# Patient Record
Sex: Female | Born: 1995 | Race: Black or African American | Hispanic: No | Marital: Single | Smoking: Never smoker
Health system: Southern US, Community
[De-identification: ages and names within clinical notes are randomized; demographics above are authoritative.]

## PROBLEM LIST (undated history)

## (undated) DIAGNOSIS — D649 Anemia, unspecified: Secondary | ICD-10-CM

## (undated) HISTORY — PX: INDUCED ABORTION: SHX677

---

## 2014-02-09 LAB — PROCEDURE REPORT - SCANNED: PAP SMEAR: ABNORMAL — AB

## 2014-03-05 ENCOUNTER — Encounter (HOSPITAL_COMMUNITY): Payer: Self-pay | Admitting: Emergency Medicine

## 2014-03-05 ENCOUNTER — Emergency Department (HOSPITAL_COMMUNITY)
Admission: EM | Admit: 2014-03-05 | Discharge: 2014-03-06 | Disposition: A | Discharge status: Home / Self Care | Payer: Medicaid Other | Attending: Emergency Medicine | Admitting: Emergency Medicine

## 2014-03-05 ENCOUNTER — Emergency Department (HOSPITAL_COMMUNITY): Payer: Medicaid Other

## 2014-03-05 DIAGNOSIS — Z3A00 Weeks of gestation of pregnancy not specified: Secondary | ICD-10-CM | POA: Insufficient documentation

## 2014-03-05 DIAGNOSIS — N898 Other specified noninflammatory disorders of vagina: Secondary | ICD-10-CM | POA: Insufficient documentation

## 2014-03-05 DIAGNOSIS — O9989 Other specified diseases and conditions complicating pregnancy, childbirth and the puerperium: Secondary | ICD-10-CM | POA: Diagnosis not present

## 2014-03-05 DIAGNOSIS — R519 Headache, unspecified: Secondary | ICD-10-CM

## 2014-03-05 DIAGNOSIS — R51 Headache: Secondary | ICD-10-CM | POA: Diagnosis not present

## 2014-03-05 DIAGNOSIS — Z3490 Encounter for supervision of normal pregnancy, unspecified, unspecified trimester: Secondary | ICD-10-CM

## 2014-03-05 LAB — COMPREHENSIVE METABOLIC PANEL
ALK PHOS: 40 U/L (ref 39–117)
ALT: 18 U/L (ref 0–35)
AST: 21 U/L (ref 0–37)
Albumin: 3 g/dL — ABNORMAL LOW (ref 3.5–5.2)
Anion gap: 12 (ref 5–15)
BUN: 8 mg/dL (ref 6–23)
CHLORIDE: 105 meq/L (ref 96–112)
CO2: 22 mEq/L (ref 19–32)
Calcium: 8.8 mg/dL (ref 8.4–10.5)
Creatinine, Ser: 0.66 mg/dL (ref 0.50–1.10)
GFR calc Af Amer: >90 mL/min (ref >90)
GFR calc non Af Amer: >90 mL/min (ref >90)
Glucose, Bld: 73 mg/dL (ref 70–99)
POTASSIUM: 4.1 meq/L (ref 3.7–5.3)
Sodium: 139 mEq/L (ref 137–147)
Total Protein: 6.8 g/dL (ref 6.0–8.3)

## 2014-03-05 LAB — URINALYSIS, ROUTINE W REFLEX MICROSCOPIC
Bilirubin Urine: NEGATIVE
Glucose, UA: NEGATIVE mg/dL
Hgb urine dipstick: NEGATIVE
KETONES UR: NEGATIVE mg/dL
Leukocytes, UA: NEGATIVE
NITRITE: NEGATIVE
Protein, ur: NEGATIVE mg/dL
Specific Gravity, Urine: 1.024 (ref 1.005–1.030)
Urobilinogen, UA: 0.2 mg/dL (ref 0.0–1.0)
pH: 6.5 (ref 5.0–8.0)

## 2014-03-05 LAB — TYPE AND SCREEN
ABO/RH(D): B NEG
Antibody Screen: NEGATIVE

## 2014-03-05 LAB — PREGNANCY, URINE: PREG TEST UR: POSITIVE — AB

## 2014-03-05 LAB — CBC
HCT: 35.4 % — ABNORMAL LOW (ref 36.0–46.0)
HEMOGLOBIN: 12.1 g/dL (ref 12.0–15.0)
MCH: 30 pg (ref 26.0–34.0)
MCHC: 34.2 g/dL (ref 30.0–36.0)
MCV: 87.6 fL (ref 78.0–100.0)
Platelets: 271 10*3/uL (ref 150–400)
RBC: 4.04 MIL/uL (ref 3.87–5.11)
RDW: 12.6 % (ref 11.5–15.5)
WBC: 4.6 10*3/uL (ref 4.0–10.5)

## 2014-03-05 LAB — HCG, QUANTITATIVE, PREGNANCY: hCG, Beta Chain, Quant, S: 19904 m[IU]/mL — ABNORMAL HIGH (ref <5)

## 2014-03-05 LAB — LIPASE, BLOOD: Lipase: 28 U/L (ref 11–59)

## 2014-03-05 LAB — WET PREP, GENITAL
Trich, Wet Prep: NONE SEEN
Yeast Wet Prep HPF POC: NONE SEEN

## 2014-03-05 LAB — ABO/RH: ABO/RH(D): B NEG

## 2014-03-05 MED ORDER — ACETAMINOPHEN 500 MG PO TABS
1000.0000 mg | ORAL_TABLET | Freq: Once | ORAL | Status: AC
  Administered 2014-03-05: 1000 mg via ORAL
  Filled 2014-03-05: qty 2

## 2014-03-05 NOTE — ED Notes (Signed)
Pt reports discharge that has been occurring "for a little while" and lower abd pain that began today - pt also admits to HA - denies fever, neck pain, vaginal bleeding or any other associating complaints. Pt is gravid however states she does not known how many weeks gestation and has not been to the OBGYN.

## 2014-03-05 NOTE — ED Notes (Signed)
Terri Piedraourtney Forcucci PA at bedside.

## 2014-03-05 NOTE — ED Notes (Signed)
Presents with intermittent abdominal cramping and vaginal discharge-unsure of when discharge started and description of discharge, cramping began today. Denies bleeding,  Unsure of how many weeks gestation she is but abdomen is gravid and she reports positive urine pregnancy test, last period in June. Pt also reports headache began today and has not gone away.

## 2014-03-05 NOTE — ED Provider Notes (Signed)
CSN: [id number]     Arrival date & time 03/05/14  2047 History   First MD Initiated Contact with Patient 03/05/14 2128     Chief Complaint  Patient presents with  . Abdominal Cramping  . Headache   Patient is a 18 y.o. female presenting with cramps and headaches.  Abdominal Cramping Associated symptoms: vaginal discharge   Associated symptoms: no chest pain, no chills, no constipation, no diarrhea, no dysuria, no fatigue, no fever, no hematuria, no nausea, no shortness of breath, no vaginal bleeding and no vomiting   Headache Associated symptoms: abdominal pain   Associated symptoms: no diarrhea, no dizziness, no fatigue, no fever, no nausea and no vomiting     Patient is a [age] y.o. Female who presents to the ED with lower abdominal cramping and headache x 1 day.   Patient states that she has had moderate intermittent crampy lower abdominal pain which started this morning.  She has found no agrravating or relieving factors at this time.  Patient states that she also has a generalized headache which started today and feels similar to prior headaches.  Patient states that she is also having a vaginal discharge but is unable to describe the discharge and urinary frequency.  Patient is currently sexually active and does not use any birth control or condoms.  Last menstrual period was in June. Per triage summary patient had a positive pregnancy urine test at home.    History reviewed. No pertinent past medical history. History reviewed. No pertinent past surgical history. History reviewed. No pertinent family history. History  Substance Use Topics  . Smoking status: Never Smoker   . Smokeless tobacco: Not on file  . Alcohol Use: No   OB History   Grav Para Term Preterm Abortions TAB SAB Ect Mult Living   1              Review of Systems  Constitutional: Negative for fever, chills and fatigue.  Eyes: Negative for visual disturbance.  Respiratory: Negative for chest tightness and  shortness of breath.   Cardiovascular: Negative for chest pain.  Gastrointestinal: Positive for abdominal pain. Negative for nausea, vomiting, diarrhea, constipation and blood in stool.  Genitourinary: Positive for vaginal discharge. Negative for dysuria, urgency, frequency, hematuria, decreased urine volume, vaginal bleeding, difficulty urinating and vaginal pain.  Neurological: Positive for headaches. Negative for dizziness, tremors, weakness and light-headedness.  All other systems reviewed and are negative.     Allergies  Review of patient's allergies indicates no known allergies.  Home Medications   Prior to Admission medications   Not on File   BP 97/43  Pulse 64  Temp(Src) 98.3 F (36.8 C) (Oral)  Resp 16  Ht 5\' 4"  (1.626 m)  Wt 120 lb (54.432 kg)  BMI 20.59 kg/m2  SpO2 99%  LMP 11/02/2013 Physical Exam  Nursing note and vitals reviewed. Constitutional: She is oriented to person, place, and time. She appears well-developed and well-nourished. No distress.  HENT:  Head: Normocephalic and atraumatic.  Mouth/Throat: Oropharynx is clear and moist. No oropharyngeal exudate.  Eyes: Conjunctivae and EOM are normal. Pupils are equal, round, and reactive to light. No scleral icterus.  Neck: Normal range of motion. Neck supple. No JVD present. No thyromegaly present.  Cardiovascular: Normal rate, regular rhythm, normal heart sounds and intact distal pulses.  Exam reveals no gallop and no friction rub.   No murmur heard. Pulmonary/Chest: Effort normal and breath sounds normal. No respiratory distress. She has no wheezes. She  has no rales. She exhibits no tenderness.  Abdominal: Soft. Normal appearance and bowel sounds are normal. She exhibits no distension and no mass. There is tenderness in the suprapubic area. There is no rigidity, no rebound, no guarding, no tenderness at McBurney's point and negative Murphy's sign.  Genitourinary: No labial fusion. There is no rash,  tenderness, lesion or injury on the right labia. There is no rash, tenderness, lesion or injury on the left labia. Uterus is enlarged. Uterus is not deviated, not fixed and not tender. Cervix exhibits discharge. Cervix exhibits no motion tenderness and no friability. Right adnexum displays no mass, no tenderness and no fullness. Left adnexum displays tenderness (Minimal left adnexal tenderness). Left adnexum displays no mass and no fullness. No erythema, tenderness or bleeding around the vagina. No foreign body around the vagina. No signs of injury around the vagina. Vaginal discharge found.  Thick white vaginal discharge without odor.  There is minimal tenderness to palpation of the left adnexa.  The cervical os appears to be closed at this time.    Musculoskeletal: Normal range of motion.  Lymphadenopathy:    She has no cervical adenopathy.  Neurological: She is alert and oriented to person, place, and time. She has normal strength. No cranial nerve deficit or sensory deficit. Coordination normal.  Skin: Skin is warm and dry. She is not diaphoretic.  Psychiatric: She has a normal mood and affect. Her behavior is normal. Judgment and thought content normal.    ED Course  Procedures (including critical care time) Labs Review Labs Reviewed  WET PREP, GENITAL - Abnormal; Notable for the following:    Clue Cells Wet Prep HPF POC FEW (*)    WBC, Wet Prep HPF POC FEW (*)    All other components within normal limits  CBC - Abnormal; Notable for the following:    HCT 35.4 (*)    All other components within normal limits  HCG, QUANTITATIVE, PREGNANCY - Abnormal; Notable for the following:    hCG, Beta Chain, Quant, S [id number] (*)    All other components within normal limits  PREGNANCY, URINE - Abnormal; Notable for the following:    Preg Test, Ur POSITIVE (*)    All other components within normal limits  URINALYSIS, ROUTINE W REFLEX MICROSCOPIC - Abnormal; Notable for the following:    APPearance  CLOUDY (*)    All other components within normal limits  COMPREHENSIVE METABOLIC PANEL - Abnormal; Notable for the following:    Albumin 3.0 (*)    Total Bilirubin <0.2 (*)    All other components within normal limits  GC/CHLAMYDIA PROBE AMP  LIPASE, BLOOD  RPR  HIV ANTIBODY (ROUTINE TESTING)  ABO/RH  TYPE AND SCREEN    Imaging Review US Ob Limited  03/06/2014   CLINICAL DATA:  Cramping in pregnancy, second trimester.  EXAM: LIMITED OBSTETRIC ULTRASOUND  FINDINGS: Number of Fetuses: 1  Heart Rate:  137 bpm  Movement: Yes per sonographer exam  Presentation: Cephalic  Placental Location: Anterior. No subplacental collection identified.  Previa: No  Amniotic Fluid (Subjective):  Within normal limits.  BPD:  3.51cm 16w  6d  MATERNAL FINDINGS:  Cervix:  Appears closed.  Uterus/Adnexae: Small volume free pelvic fluid in the pelvis which appears simple.  IMPRESSION: 1. Single living intrauterine gestation.  No adverse fetal findings. 2. Small volume free fluid in the pelvis.  This exam is performed on an emergent basis and does not comprehensively evaluate fetal size, dating, or anatomy; follow-up complete OB  US should be considered if further fetal assessment is warranted.   Electronically Signed   By: Tiburcio PeaJonathan  Watts M.D.   On: 03/06/2014 02:33     EKG Interpretation None      MDM   Final diagnoses:  Pregnancy  Nonintractable headache, unspecified chronicity pattern, unspecified headache type   Patient is a 18 y.o. Female who presents to the ED with headache and vaginal discharge.  Physical exam reveals alert and non-toxic appearing female with a gravid abdomen.  Os appears to be closed at this time.  CBC unremarkable.  CMP reveals mild hypoalbuminemia.  Lipase is negative.  UA is negative.  Urine pregnancy is positive.  Wet prep shows few clue cells.  RPR and GC pending.  HIV pending.  ABO reveals B neg.  There is no vaginal bleeding so rhogam is not indicated at this time.  Ultrasound  reveals an intrauterine pregnancy with normal fetal heart tones.  There are no other acute findings. Patient to be discharged home at this time to follow-up with ObGyn.  Patient to return for vaginal bleeding, contractions, or leakage of fluids.  Patient states understanding and agreement.  Patient discussed with Dr. Lynelle DoctorKnapp who agrees with the above plan and workup.      Eben Burowourtney A Forcucci, PA-C 03/06/14 [id number]  Eben Burowourtney A Forcucci, PA-C 03/06/14 0330

## 2014-03-06 ENCOUNTER — Emergency Department (HOSPITAL_COMMUNITY): Payer: Medicaid Other

## 2014-03-06 LAB — HIV ANTIBODY (ROUTINE TESTING W REFLEX): HIV 1&2 Ab, 4th Generation: NONREACTIVE

## 2014-03-06 LAB — RPR

## 2014-03-06 NOTE — ED Notes (Signed)
Patient transported to Ultrasound 

## 2014-03-06 NOTE — ED Notes (Signed)
Pt A&OX4, ambulatory at d/c with steady gait, NAD 

## 2014-03-06 NOTE — Discharge Instructions (Signed)
Medicines During Pregnancy During pregnancy, there are medicines that are either safe or unsafe to take. Medicines include prescriptions from your caregiver, over-the-counter medicines, topical creams applied to the skin, and all herbal substances. Medicines are put into either Class A, B, C, or D. Class A and B medicines have been shown to be safe in pregnancy. Class C medicines are also considered to be safe in pregnancy, but these medicines should only be used when necessary. Class D medicines should not be used at all in pregnancy. They can be harmful to a baby.  It is best to take as little medicine as possible while pregnant. However, some medicines are necessary to take for the mother and baby's health. Sometimes, it is more dangerous to stop taking certain medicines than to stay on them. This is often the case for people with long-term (chronic) conditions such as asthma, diabetes, or high blood pressure (hypertension). If you are pregnant and have a chronic illness, call your caregiver right away. Bring a list of your medicines and their doses to your appointments. If you are planning to become pregnant, schedule a doctor's appointment and discuss your medicines with your caregiver. Lastly, write down the phone number to your pharmacist. They can answer questions regarding a medicine's class and safety. They cannot give advice as to whether you should or should not be on a medicine.  SAFE AND UNSAFE MEDICINES There is a long list of medicines that are considered safe in pregnancy. Below is a shorter list. For specific medicines, ask your caregiver.  AllergyMedicines Loratadine, cetirizine, and chlorpheniramine are safe to take. Certain nasal steroid sprays are safe. Talk to your caregiver about specific brands that are safe. Analgesics Acetaminophen and acetaminophen with codeine are safe to take. All other nonsteroidal anti-inflammatory drugs (NSAIDS) are not safe. This includes ibuprofen.    Antacids Many over-the-counter antacids are safe to take. Talk to your caregiver about specific brands that are safe. Famotidine, ranitidine, and lansoprazole are safe. Omepresole is considered safe to take in the second trimester. Antibiotic Medicines There are several antibiotics to avoid. These include, but are not limited to, tetracyline, quinolones, and sulfa medications. Talk to your caregiver before taking any antibiotic.  Antihistamines Talk to your caregiver about specific brands that are safe.  Asthma Medicines Most asthma steroid inhalers are safe to take. Talk to your caregiver for specific details. Calcium Calcium supplements are safe to take. Do not take oyster shell calcium.  Cough and Cold Medicines It is safe to take products with guaifenesin or dextromethorphan. Talk to your caregiver about specific brands that are safe. It is not safe to take products that contain aspirin or ibuprofen. Decongestant Medicines Pseudoephedrine-containing products are safe to take in the second and third trimester.  Depression Medicines Talk about these medicines with your caregiver.  Antidiarrheal Medicines It is safe to take loperamide. Talk to your caregiver about specific brands that are safe. It is not safe to take any antidiarrheal medicine that contains bismuth. Eyedrops Allergy eyedrops should be limited.  Iron It is safe to use certain iron-containing medicines for anemia in pregnancy. They require a prescription.  Antinausea Medicines It is safe to take doxylamine and vitamin B6 as directed. There are other prescription medicines available, if needed.  Sleep aids It is safe to take diphenhydramine and acetaminophen with diphenhydramine.  Steroids Hydrocortisone creams are safe to use as directed. Oral steroids require a prescription. It is not safe to take any hemorrhoid cream with pramoxine or phenylephrine.  Stool softener It is safe to take stool softener  medicines. Avoid daily or prolonged use of stool softeners. Thyroid Medicine It is important to stay on this thyroid medicine. It needs to be followed by your caregiver.  Vaginal Medicines Your caregiver will prescribe a medicine to you if you have a vaginal infection. Certain antifungal medicines are safe to use if you have a sexually transmitted infection (STI). Talk to your caregiver.  Document Released: 04/24/2005 Document Revised: 07/17/2011 Document Reviewed: 04/25/2011 [facility] Patient Information 2015 [place], [place]. This information is not intended to replace advice given to you by your health care provider. Make sure you discuss any questions you have with your health care provider.   Emergency Department Resource Guide 1) Find a Doctor and Pay Out of Pocket Although you won't have to find out who is covered by your insurance plan, it is a good idea to ask around and get recommendations. You will then need to call the office and see if the doctor you have chosen will accept you as a new patient and what types of options they offer for patients who are self-pay. Some doctors offer discounts or will set up payment plans for their patients who do not have insurance, but you will need to ask so you aren't surprised when you get to your appointment.  2) Contact Your Local Health Department Not all health departments have doctors that can see patients for sick visits, but many do, so it is worth a call to see if yours does. If you don't know where your local health department is, you can check in your phone book. The CDC also has a tool to help you locate your state's health department, and many state websites also have listings of all of their local health departments.  3) Find a Walk-in Clinic If your illness is not likely to be very severe or complicated, you may want to try a walk in clinic. These are popping up all over the country in pharmacies, drugstores, and shopping centers. They're  usually staffed by nurse practitioners or physician assistants that have been trained to treat common illnesses and complaints. They're usually fairly quick and inexpensive. However, if you have serious medical issues or chronic medical problems, these are probably not your best option.  No Primary Care Doctor: - Call Health Connect at  [phone] - they can help you locate a primary care doctor that  accepts your insurance, provides certain services, etc. - Physician Referral Service- [phone]  Chronic Pain Problems: Organization         Address  Phone   Notes  [facility]  [phone]) [phone] Patients need to be referred by their primary care doctor.   Medication Assistance: Organization         Address  Phone   Notes  [facility] Medication [facility] [address] [place], [place] [address] [phone]) [phone] --Must be a resident of [place] [facility] [facility] -- Must have NO insurance coverage whatsoever (no Medicaid/ Medicare, etc.) -- The pt. MUST have a primary care doctor that directs their care regularly and follows them in the community   MedAssist  [phone]) [phone]   [employer]  [phone]) [phone]    Agencies that provide inexpensive medical care: Organization         Address  Phone   Notes  [facility]  [phone]   [facility]    [phone]   [facility]  Outpatient Clinic [address] [place], [place] [address] [phone]   [facility] [address], [place] [phone]   Planned Parenthood    [phone]   [facility]    [phone]   [facility]  [address], [place] Phone:  [phone]) [phone], Fax:  [phone] Hours of Operation:  9 am - 6 pm, M-F.  Also accepts Medicaid/Medicare and self-pay.  [facility]  [address], [place] Phone: [phone], Fax: [phone]. Hours  of Operation:  8:30 am - 5:30 pm, M-F.  Also accepts Medicaid and self-pay.  [facility] [place] [address], [place] Phone: [phone]) [phone]   [facility] [address], [place], [place] [phone], Ext. [phone] Mondays & Thursdays: 7-9 AM.  First 15 patients are seen on a first come, first serve basis.    Medicaid-accepting [facility] [place] Providers:  Organization         Address  Phone   Notes  [facility] [address], [place] [phone] Also accepts self-pay patients.  [facility] [address], [place]  [phone]   [facility] [address], [place] [phone]   [facility] [address] [place] [address], [place] [phone]) [phone]   Renaye RakersVeita Bland [address], [place]   [phone]) [phone] Only accepts [place] Access [place] patients after they have their name applied to their card.   Self-Pay (no insurance) in [facility] [place]:  Organization         Address  Phone   Notes  Sickle Cell Patients, [facility] [address], [place] [phone]   [facility] [address], [place] [phone]   [facility] [place]  [address], [place] [phone]   [facility]/Dr. Osei-Bonsu  [address] [place] [address], [place] or [phone], [place] [phone] Phone number for both [place] and [place] locations is the same.  [facility] [address], [place] [phone]) [phone]   [facility] [place] [address] [place] [address], [place] or [address] [phone]) [phone]   [facility] [address], [place] [phone]) [phone], phone; [phone](336) [phone], fax Sees patients 1st and 3rd Saturday of every month.  Must not qualify for public or private insurance (i.e. Medicaid,  Medicare, [place] Health Choice, Veterans' Benefits)  Household income should be no more than 200% of the poverty level The clinic cannot treat you if you are pregnant or think you are pregnant  Sexually transmitted diseases are not treated at the clinic.    Dental Care: Organization         Address  Phone  Notes  [facility] Department of [facility] [address], [place] [phone]) [phone] Accepts children up to age 19 who are enrolled in [place]icaid or [place] Health Choice; pregnant women with a Medicaid card; and children who have applied for Medicaid or [place] Health Choice, but were declined, whose parents can pay a reduced fee at time of service.  [facility] Department of [facility] [place]  [address] [place] [address], [place] [phone] Accepts children up to age 19 who are enrolled  in Medicaid or [place] Health Choice; pregnant women with a Medicaid card; and children who have applied for Medicaid or [place] Health Choice, but were declined, whose parents can pay a reduced fee at time of service.  [facility]  [address], [place] [phone] Patients are seen by appointment only. Walk-ins are not accepted. [facility] will see patients 918 years of age and older. Monday - Tuesday (8am-5pm) Most Wednesdays (8:30-5pm) $30 per visit, cash only  [facility]  [address], [facility] [phone] Patients are seen by appointment only. Walk-ins are not accepted. [facility] will see patients 418 years of age and older. One Wednesday Evening (Monthly: Volunteer Based).  $30 per visit, cash only  [employer] [facility]  [phone] for adults; Children under age 214, call Graduate Pediatric Dentistry at [phone]. Children aged 374-14, please call [phone] to request a pediatric application.  Dental services are provided in all areas of dental care including fillings, crowns  and bridges, complete and partial dentures, implants, gum treatment, root canals, and extractions. Preventive care is also provided. Treatment is provided to both adults and children. Patients are selected via a lottery and there is often a waiting list.   [facility] [address], [place]  [phone] [email or web address]   [facility] [address], [place], [place] [phone], Ext. [phone] Second and Fourth Thursday of each month, opens at 6:30 AM; Clinic ends at 9 AM.  Patients are seen on a first-come first-served basis, and a limited number are seen during each clinic.   [facility]  [address], [place], [place] [phone]   Eligibility Requirements You must have lived in [place], [place], or [place] counties for at least the last three months.   You cannot be eligible for state or federal sponsored National Cityhealthcare insurance, including CIGNAVeterans Administration, [place]icaid, or Harrah[employer] EntertainmentMedicare.   You generally cannot be eligible for healthcare insurance through your employer.    How to apply: Eligibility screenings are held every Tuesday and Wednesday afternoon from 1:00 pm until 4:00 pm. You do not need an appointment for the interview!  [facility] [address], [place], [place] [phone]   [facility]  [phone]   [facility]  [phone]   [facility]  [phone]    Behavioral Health Resources in the Community: Intensive Outpatient Programs Organization         Address  Phone  Notes  [facility] [address], [place], [place] [phone]   [facility] Outpatient [address], [place], [place] [phone]   [facility]: Alcohol [facility] Drug S[facility] [address], [place], [place]  [phone]   [facility] [address],  [place], [place] [phone] or [phone]   Substance Abuse  Resources Organization         Address  Phone  Notes  [facility] Drug Services  [phone]   [facility]  [phone]   [facility]  [phone]   [facility]  [phone]   Residential & Outpatient Substance Abuse Program  [phone]   Psychological Services Organization         Address  Phone  Notes  [facility]  [phone]- [phone]   [facility]  [phone]- [phone]   [facility] [address], [place] [phone]  or [phone]    Mobile Crisis Teams Organization         Address  Phone  Notes  [facility]atives, Mobile Crisis Care Unit  [phone]   [facility]  [address] [place], [place]   [facility] [address], [place] [phone]    Self-Help/Support Groups Organization         Address  Phone             Notes  [place] of [place] - variety of support groups  Brilliant Call for more information  Narcotics Anonymous (NA), [facility] [address], [employer] [place]  2 meetings at this location   Special educational needs teacher         Address  Phone  Notes  ASAP Residential Treatment [place],    [place]  [phone]   [facility]  [address] [place] [id number], [place], [place]   [place], [place] [phone] Admissions: 8am-3pm M-F  Incentives Substance [place] [address]    [place], [place] [id number]   [facility] [address] [place], [place], [place]   [facility] [address]  [place], [place]   [facility] Programs - Intensive Outpatient [address], [place], [place]   [facility] [address]  [place], [place] [phone] or [phone]   [facility] (RTS) [address] [place], [place] Accepts Medicaid  [facility] [place] [address]  [place] [phone] Substance Abuse/Addiction Treatment   [facility] Organization         Address  Phone  Notes  [facility]  [phone]   Domenic Schwab, PhD [address] Arlis Porta [place], [place]   [phone] or [phone]   [place] [address] [place] [place], [place] [phone]   [facility] [address], [place], [place] [phone] Insurance/Medicaid/sponsorship through [employer] and Families [address] [place]                                    [place], [place] [phone] Tat Momoli [address], [place] [phone]    Dr. Adele Schilder  [phone]   [facility]. 1) [address], [place] 2) [place] 3)  [place] [address], [place] [phone]  [phone]   [place] [phone] or [phone] (After Hours)

## 2014-03-07 LAB — GC/CHLAMYDIA PROBE AMP
CT Probe RNA: NEGATIVE
GC PROBE AMP APTIMA: NEGATIVE

## 2014-03-10 ENCOUNTER — Encounter (HOSPITAL_COMMUNITY): Payer: Self-pay | Admitting: Emergency Medicine

## 2014-06-03 ENCOUNTER — Other Ambulatory Visit: Payer: Self-pay | Admitting: Emergency Medicine

## 2014-06-03 DIAGNOSIS — R102 Pelvic and perineal pain: Secondary | ICD-10-CM

## 2014-06-05 ENCOUNTER — Ambulatory Visit
Admission: RE | Admit: 2014-06-05 | Discharge: 2014-06-05 | Disposition: A | Discharge status: Home / Self Care | Payer: Medicaid Other | Source: Ambulatory Visit | Attending: Emergency Medicine | Admitting: Emergency Medicine

## 2014-06-05 DIAGNOSIS — R102 Pelvic and perineal pain: Secondary | ICD-10-CM

## 2014-06-06 ENCOUNTER — Encounter (HOSPITAL_COMMUNITY): Payer: Self-pay | Admitting: Emergency Medicine

## 2014-06-06 ENCOUNTER — Emergency Department (HOSPITAL_COMMUNITY)
Admission: EM | Admit: 2014-06-06 | Discharge: 2014-06-06 | Disposition: A | Discharge status: Home / Self Care | Payer: Medicaid Other | Attending: Emergency Medicine | Admitting: Emergency Medicine

## 2014-06-06 DIAGNOSIS — N730 Acute parametritis and pelvic cellulitis: Secondary | ICD-10-CM | POA: Insufficient documentation

## 2014-06-06 DIAGNOSIS — Z3202 Encounter for pregnancy test, result negative: Secondary | ICD-10-CM | POA: Diagnosis not present

## 2014-06-06 DIAGNOSIS — R1030 Lower abdominal pain, unspecified: Secondary | ICD-10-CM | POA: Diagnosis present

## 2014-06-06 LAB — COMPREHENSIVE METABOLIC PANEL
ALT: 11 U/L (ref 0–35)
ANION GAP: 7 (ref 5–15)
AST: 18 U/L (ref 0–37)
Albumin: 3.7 g/dL (ref 3.5–5.2)
Alkaline Phosphatase: 55 U/L (ref 39–117)
BUN: 8 mg/dL (ref 6–23)
CO2: 27 mmol/L (ref 19–32)
CREATININE: 0.86 mg/dL (ref 0.50–1.10)
Calcium: 9 mg/dL (ref 8.4–10.5)
Chloride: 105 mmol/L (ref 96–112)
GFR calc non Af Amer: >90 mL/min (ref >90)
Glucose, Bld: 73 mg/dL (ref 70–99)
POTASSIUM: 3.9 mmol/L (ref 3.5–5.1)
Sodium: 139 mmol/L (ref 135–145)
Total Bilirubin: 0.7 mg/dL (ref 0.3–1.2)
Total Protein: 7.2 g/dL (ref 6.0–8.3)

## 2014-06-06 LAB — URINALYSIS, ROUTINE W REFLEX MICROSCOPIC
Bilirubin Urine: NEGATIVE
Glucose, UA: NEGATIVE mg/dL
KETONES UR: 40 mg/dL — AB
NITRITE: NEGATIVE
PROTEIN: NEGATIVE mg/dL
SPECIFIC GRAVITY, URINE: 1.026 (ref 1.005–1.030)
UROBILINOGEN UA: 0.2 mg/dL (ref 0.0–1.0)
pH: 5.5 (ref 5.0–8.0)

## 2014-06-06 LAB — CBC
HEMATOCRIT: 36 % (ref 36.0–46.0)
Hemoglobin: 12.1 g/dL (ref 12.0–15.0)
MCH: 28.9 pg (ref 26.0–34.0)
MCHC: 33.6 g/dL (ref 30.0–36.0)
MCV: 85.9 fL (ref 78.0–100.0)
PLATELETS: 256 10*3/uL (ref 150–400)
RBC: 4.19 MIL/uL (ref 3.87–5.11)
RDW: 13.5 % (ref 11.5–15.5)
WBC: 5.4 10*3/uL (ref 4.0–10.5)

## 2014-06-06 LAB — PREGNANCY, URINE: Preg Test, Ur: NEGATIVE

## 2014-06-06 LAB — URINE MICROSCOPIC-ADD ON

## 2014-06-06 LAB — WET PREP, GENITAL
Clue Cells Wet Prep HPF POC: NONE SEEN
TRICH WET PREP: NONE SEEN
YEAST WET PREP: NONE SEEN

## 2014-06-06 MED ORDER — DOXYCYCLINE HYCLATE 100 MG PO CAPS: 100.0000 mg | ORAL_CAPSULE | Freq: Two times a day (BID) | ORAL | Status: DC

## 2014-06-06 MED ORDER — AZITHROMYCIN 250 MG PO TABS
1000.0000 mg | ORAL_TABLET | Freq: Once | ORAL | Status: AC
  Administered 2014-06-06: 1000 mg via ORAL
  Filled 2014-06-06: qty 4

## 2014-06-06 MED ORDER — LIDOCAINE HCL (PF) 1 % IJ SOLN
INTRAMUSCULAR | Status: AC
  Administered 2014-06-06: 5 mL
  Filled 2014-06-06: qty 5

## 2014-06-06 MED ORDER — CEFTRIAXONE SODIUM 250 MG IJ SOLR
250.0000 mg | Freq: Once | INTRAMUSCULAR | Status: AC
  Administered 2014-06-06: 250 mg via INTRAMUSCULAR
  Filled 2014-06-06: qty 250

## 2014-06-06 NOTE — ED Provider Notes (Signed)
CSN: [id number]     Arrival date & time 06/06/14  1214 History   First MD Initiated Contact with Patient 06/06/14 1300     Chief Complaint  Patient presents with  . Abdominal Pain     (Consider location/radiation/quality/duration/timing/severity/associated sxs/prior Treatment) HPI   19 year old female who presents for evaluation of abdominal pain. Patient reports intermittent lower abdominal pain which has been ongoing for the past week. She described pain as an achy sensation, nonradiating, sometimes worsen with movement. Pain has been increasing in frequency. Pain improves with taking ibuprofen. She does report having mild increasing urinary frequency and urgency without burning on urination. No associate fever, chills, chest pain, shortness of breath, back pain, dysuria, vaginal bleeding, vaginal discharge, or rash. She is sexually active with prior history of Chlamydia. She has an IUD. No prior abdominal surgery. The pain is mild to moderate at this time.  History reviewed. No pertinent past medical history. History reviewed. No pertinent past surgical history. No family history on file. History  Substance Use Topics  . Smoking status: Never Smoker   . Smokeless tobacco: Not on file  . Alcohol Use: No   OB History    Gravida Para Term Preterm AB TAB SAB Ectopic Multiple Living   1              Review of Systems  All other systems reviewed and are negative.     Allergies  Review of patient's allergies indicates no known allergies.  Home Medications   Prior to Admission medications   Not on File   BP 120/71 mmHg  Pulse 88  Temp(Src) 98.2 F (36.8 C) (Oral)  Resp 14  SpO2 99%  LMP 11/02/2013  Breastfeeding? Unknown Physical Exam  Constitutional: She appears well-developed and well-nourished. No distress.  HENT:  Head: Atraumatic.  Eyes: Conjunctivae are normal.  Neck: Neck supple.  Cardiovascular: Normal rate and regular rhythm.   Pulmonary/Chest: Effort  normal and breath sounds normal.  Abdominal: Soft. There is tenderness (Mild suprapubic tenderness without guarding or rebound tenderness). There is no rebound and no guarding.  Negative Murphy sign, no pain at McBurney's point.  Genitourinary:  Chaperone present: Left inguinal lymphadenopathy noted with mild tenderness. No inguinal hernia noted. Normal external genitalia. Mild discomfort with speculum insertion. Vaginal vault with small amount of blood and discharge. Cervical os is visualized, and closed. On bimanual examination, patient has adnexal tenderness bilaterally with cervical motion tenderness. No mass appreciated.   Neurological: She is alert.  Skin: No rash noted.  Psychiatric: She has a normal mood and affect.  Nursing note and vitals reviewed.   ED Course  Procedures (including critical care time)  Patient with low abdominal pain. Patient has a lot of discomfort on pelvic examination concerning for possible STDs. Given the fact that she is sexually active and having symptoms, will treat for PID. Rocephin and Zithromax given here in the ER, she'll be discharged with doxycycline for 2 weeks.  Pt recommend avoid sexual activities until sxs resolved.  Pt also made aware to notify partner if tested positive for STD.  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  She is made aware of this finding with recommend to f/u with the provider who placed the IUD initially for further care.    Labs Review Labs Reviewed  WET PREP, GENITAL - Abnormal; Notable for the following:    WBC, Wet Prep HPF POC TOO NUMEROUS TO COUNT (*)  All other components within normal limits  URINALYSIS, ROUTINE W REFLEX MICROSCOPIC - Abnormal; Notable for the following:    Color, Urine AMBER (*)    Hgb urine dipstick TRACE (*)    Ketones, ur 40 (*)    Leukocytes, UA SMALL (*)    All other components within normal limits  URINE MICROSCOPIC-ADD ON - Abnormal;  Notable for the following:    Squamous Epithelial / LPF FEW (*)    All other components within normal limits  CBC  PREGNANCY, URINE  COMPREHENSIVE METABOLIC PANEL  RPR  HIV ANTIBODY (ROUTINE TESTING)  GC/CHLAMYDIA PROBE AMP ([place])    Imaging Review US Transvaginal Non-ob  06/05/2014   CLINICAL DATA:  Pelvic pain in female. Right lower quadrant pain for 1 week. LMP approximately 1 month ago. Exact date unknown.  EXAM: TRANSABDOMINAL AND TRANSVAGINAL ULTRASOUND OF PELVIS  TECHNIQUE: Both transabdominal and transvaginal ultrasound examinations of the pelvis were performed. Transabdominal technique was performed for global imaging of the pelvis including uterus, ovaries, adnexal regions, and pelvic cul-de-sac. It was necessary to proceed with endovaginal exam following the transabdominal exam to visualize the endometrial thickness and ovaries.  COMPARISON:  None  FINDINGS: Uterus  Measurements: 10.1 x 3.9 x 4.7 cm. No fibroids or other mass visualized.  Endometrium  Thickness: 3 mm. An IUD is seen in the lower and mid portion of the endometrial cavity but is not extend into the fundal aspect of the endometrial cavity.  Right ovary  Measurements: 2.8 x 2.6 x 3.9 cm. Normal appearance/no adnexal mass.  Left ovary  Measurements: 3.8 x 1.5 x 2.0 cm. Normal appearance/no adnexal mass.  Other findings  No free fluid.  IMPRESSION: IUD present but appears malpositioned within the lower and mid portion of the endometrial cavity.  Normal appearance of both ovaries. No pelvic mass or fluid collections identified.   Electronically Signed   By: Myles Rosenthal M.D.   On: 06/05/2014 15:41   US Pelvis Complete  06/05/2014   CLINICAL DATA:  Pelvic pain in female. Right lower quadrant pain for 1 week. LMP approximately 1 month ago. Exact date unknown.  EXAM: TRANSABDOMINAL AND TRANSVAGINAL ULTRASOUND OF PELVIS  TECHNIQUE: Both transabdominal and transvaginal ultrasound examinations of the pelvis were performed.  Transabdominal technique was performed for global imaging of the pelvis including uterus, ovaries, adnexal regions, and pelvic cul-de-sac. It was necessary to proceed with endovaginal exam following the transabdominal exam to visualize the endometrial thickness and ovaries.  COMPARISON:  None  FINDINGS: Uterus  Measurements: 10.1 x 3.9 x 4.7 cm. No fibroids or other mass visualized.  Endometrium  Thickness: 3 mm. An IUD is seen in the lower and mid portion of the endometrial cavity but is not extend into the fundal aspect of the endometrial cavity.  Right ovary  Measurements: 2.8 x 2.6 x 3.9 cm. Normal appearance/no adnexal mass.  Left ovary  Measurements: 3.8 x 1.5 x 2.0 cm. Normal appearance/no adnexal mass.  Other findings  No free fluid.  IMPRESSION: IUD present but appears malpositioned within the lower and mid portion of the endometrial cavity.  Normal appearance of both ovaries. No pelvic mass or fluid collections identified.   Electronically Signed   By: Myles Rosenthal M.D.   On: 06/05/2014 15:41     EKG Interpretation None      MDM   Final diagnoses:  PID (acute pelvic inflammatory disease)    BP 121/82 mmHg  Pulse 67  Temp(Src) 98.2 F (36.8 C) (Oral)  Resp 16  SpO2 100%  LMP 11/02/2013  Breastfeeding? Unknown  I have reviewed nursing notes and vital signs. I personally reviewed the imaging tests through PACS system  I reviewed available ER/hospitalization records thought the EMR     Fayrene Helper, PA-C 06/06/14 1430  Suzi Roots, MD 06/07/14 [phone]

## 2014-06-06 NOTE — Discharge Instructions (Signed)
Pelvic Inflammatory Disease °Pelvic inflammatory disease (PID) refers to an infection in some or all of the female organs. The infection can be in the uterus, ovaries, fallopian tubes, or the surrounding tissues in the pelvis. PID can cause abdominal or pelvic pain that comes on suddenly (acute pelvic pain). PID is a serious infection because it can lead to lasting (chronic) pelvic pain or the inability to have children (infertile).  °CAUSES  °The infection is often caused by the normal bacteria found in the vaginal tissues. PID may also be caused by an infection that is spread during sexual contact. PID can also occur following:  °· The birth of a baby.   °· A miscarriage.   °· An abortion.   °· Major pelvic surgery.   °· The use of an intrauterine device (IUD).   °· A sexual assault.   °RISK FACTORS °Certain factors can put a person at higher risk for PID, such as: °· Being younger than 25 years. °· Being sexually active at a young age. °· Using nonbarrier contraception. °· Having multiple sexual partners. °· Having sex with someone who has symptoms of a genital infection. °· Using oral contraception. °Other times, certain behaviors can increase the possibility of getting PID, such as: °· Having sex during your period. °· Using a vaginal douche. °· Having an intrauterine device (IUD) in place. °SYMPTOMS  °· Abdominal or pelvic pain.   °· Fever.   °· Chills.   °· Abnormal vaginal discharge. °· Abnormal uterine bleeding.   °· Unusual pain shortly after finishing your period. °DIAGNOSIS  °Your caregiver will choose some of the following methods to make a diagnosis, such as:  °· Performing a physical exam and history. A pelvic exam typically reveals a very tender uterus and surrounding pelvis.   °· Ordering laboratory tests including a pregnancy test, blood tests, and urine test.  °· Ordering cultures of the vagina and cervix to check for a sexually transmitted infection (STI). °· Performing an ultrasound.    °· Performing a laparoscopic procedure to look inside the pelvis.   °TREATMENT  °· Antibiotic medicines may be prescribed and taken by mouth.   °· Sexual partners may be treated when the infection is caused by a sexually transmitted disease (STD).   °· Hospitalization may be needed to give antibiotics intravenously. °· Surgery may be needed, but this is rare. °It may take weeks until you are completely well. If you are diagnosed with PID, you should also be checked for human immunodeficiency virus (HIV).   °HOME CARE INSTRUCTIONS  °· If given, take your antibiotics as directed. Finish the medicine even if you start to feel better.   °· Only take over-the-counter or prescription medicines for pain, discomfort, or fever as directed by your caregiver.   °· Do not have sexual intercourse until treatment is completed or as directed by your caregiver. If PID is confirmed, your recent sexual partner(s) will need treatment.   °· Keep your follow-up appointments. °SEEK MEDICAL CARE IF:  °· You have increased or abnormal vaginal discharge.   °· You need prescription medicine for your pain.   °· You vomit.   °· You cannot take your medicines.   °· Your partner has an STD.   °SEEK IMMEDIATE MEDICAL CARE IF:  °· You have a fever.   °· You have increased abdominal or pelvic pain.   °· You have chills.   °· You have pain when you urinate.   °· You are not better after 72 hours following treatment.   °MAKE SURE YOU:  °· Understand these instructions. °· Will watch your condition. °· Will get help right away if you are not doing well or get worse. °  Document Released: 04/24/2005 Document Revised: 08/19/2012 Document Reviewed: 04/20/2011 °[employer]® Patient Information ©2015 [employer]. This information is not intended to replace advice given to you by your health care provider. Make sure you discuss any questions you have with your health care provider. ° °

## 2014-06-06 NOTE — ED Notes (Signed)
Pt. Stated, I've had abdominal pain for a week no other symptoms.

## 2014-06-08 ENCOUNTER — Emergency Department (HOSPITAL_COMMUNITY)
Admission: EM | Admit: 2014-06-08 | Discharge: 2014-06-08 | Disposition: A | Discharge status: Home / Self Care | Payer: Medicaid Other | Attending: Emergency Medicine | Admitting: Emergency Medicine

## 2014-06-08 ENCOUNTER — Encounter (HOSPITAL_COMMUNITY): Payer: Self-pay | Admitting: Adult Health

## 2014-06-08 DIAGNOSIS — Z792 Long term (current) use of antibiotics: Secondary | ICD-10-CM | POA: Diagnosis not present

## 2014-06-08 DIAGNOSIS — J029 Acute pharyngitis, unspecified: Secondary | ICD-10-CM

## 2014-06-08 LAB — GC/CHLAMYDIA PROBE AMP (~~LOC~~) NOT AT ARMC
Chlamydia: POSITIVE — AB
Neisseria Gonorrhea: NEGATIVE

## 2014-06-08 LAB — RAPID STREP SCREEN (MED CTR MEBANE ONLY): Streptococcus, Group A Screen (Direct): NEGATIVE

## 2014-06-08 LAB — MONONUCLEOSIS SCREEN: MONO SCREEN: NEGATIVE

## 2014-06-08 NOTE — ED Notes (Signed)
Pt a/o x 4 on d/c with steady gait. 

## 2014-06-08 NOTE — ED Provider Notes (Signed)
CSN: [id number]     Arrival date & time 06/08/14  1841 History  This chart was scribed for non-physician practitioner, Wynetta Emery, PA-C working with Rolan Bucco, MD by Greggory Stallion, ED scribe. This patient was seen in room TR10C/TR10C and the patient's care was started at 9:07 PM.    Chief Complaint  Patient presents with  . Sore Throat   The history is provided by the patient. No language interpreter was used.    HPI Comments: Icie Kuznicki is a 19 y.o. female who presents to the Emergency Department complaining of worsening sore throat that started 2 days ago. Also reports intermittent chills. She has not taken any medications. Denies fever, rhinorrhea, nasal congestion, cough, nausea, emesis.   History reviewed. No pertinent past medical history. History reviewed. No pertinent past surgical history. History reviewed. No pertinent family history. History  Substance Use Topics  . Smoking status: Never Smoker   . Smokeless tobacco: Not on file  . Alcohol Use: No   OB History    Gravida Para Term Preterm AB TAB SAB Ectopic Multiple Living   1              Review of Systems A complete 10 system review of systems was obtained and all systems are negative except as noted in the HPI and PMH.   Allergies  Review of patient's allergies indicates no known allergies.  Home Medications   Prior to Admission medications   Medication Sig Start Date End Date Taking? Authorizing Provider  doxycycline (VIBRAMYCIN) 100 MG capsule Take 1 capsule (100 mg total) by mouth 2 (two) times daily. One po bid x 7 days 06/06/14   Fayrene Helper, PA-C  ibuprofen (ADVIL,MOTRIN) 200 MG tablet Take 600 mg by mouth every 6 (six) hours as needed (pain).    Historical Provider, MD  levonorgestrel (MIRENA) 20 MCG/24HR IUD 1 each by Intrauterine route once.    Historical Provider, MD   BP 116/58 mmHg  Pulse 75  Temp(Src) 98.4 F (36.9 C) (Oral)  Resp 18  SpO2 99%  LMP 11/02/2013   Physical Exam   Constitutional: She is oriented to person, place, and time. She appears well-developed and well-nourished. No distress.  HENT:  Head: Normocephalic and atraumatic.  Mouth/Throat: Oropharynx is clear and moist.  No drooling or stridor. Posterior pharynx mildly erythematous no significant tonsillar hypertrophy. No exudate. Soft palate rises symmetrically. No TTP or induration under tongue.   No tenderness to palpation of frontal or bilateral maxillary sinuses.  No mucosal edema in the nares.  Bilateral tympanic membranes with normal architecture and good light reflex.    Eyes: Conjunctivae and EOM are normal. Pupils are equal, round, and reactive to light.  Neck: Normal range of motion. Neck supple.  Cardiovascular: Normal rate, regular rhythm and intact distal pulses.   Pulmonary/Chest: Effort normal and breath sounds normal. No stridor.  Abdominal: Soft. Bowel sounds are normal.  Musculoskeletal: Normal range of motion.  Lymphadenopathy:    She has no cervical adenopathy.  Neurological: She is alert and oriented to person, place, and time.  Psychiatric: She has a normal mood and affect.  Nursing note and vitals reviewed.   ED Course  Procedures (including critical care time)  DIAGNOSTIC STUDIES: Oxygen Saturation is 99% on RA, normal by my interpretation.    COORDINATION OF CARE: 9:08 PM-Discussed treatment plan which includes symptomatic treatment with pt at bedside and pt agreed to plan.   Labs Review Labs Reviewed  RAPID STREP SCREEN  CULTURE, GROUP A STREP  MONONUCLEOSIS SCREEN    Imaging Review No results found.   EKG Interpretation None      MDM   Final diagnoses:  Viral pharyngitis    Filed Vitals:   06/08/14 1846 06/08/14 2113  BP: 116/58 105/71  Pulse: 75 67  Temp: 98.4 F (36.9 C) 98.8 F (37.1 C)  TempSrc: Oral Oral  Resp: 18 16  SpO2: 99% 100%    Janan HalterJasmine Damian is a pleasant 19 y.o. female presenting with sore throat onset several  days ago, patient is afebrile and well-appearing. Strep and mono are negative. Physical exam is without abnormality. Advised Motrin for pain relief. Discussed return precautions.  Evaluation does not show pathology that would require ongoing emergent intervention or inpatient treatment. Pt is hemodynamically stable and mentating appropriately. Discussed findings and plan with patient/guardian, who agrees with care plan. All questions answered. Return precautions discussed and outpatient follow up given.    I personally performed the services described in this documentation, which was scribed in my presence. The recorded information has been reviewed and is accurate.  Joni Reiningicole Megan Hayduk, PA-C 06/09/14 [id number]  Rolan BuccoMelanie Belfi, MD 06/12/14 [phone]

## 2014-06-08 NOTE — Discharge Instructions (Signed)
Do not hesitate to return to the emergency room for any new, worsening or concerning symptoms. ° °Please obtain primary care using resource guide below. But the minute you were seen in the emergency room and that they will need to obtain records for further outpatient management. ° °.reso ° °Emergency Department Resource Guide °1) Find a Doctor and Pay Out of Pocket °Although you won't have to find out who is covered by your insurance plan, it is a good idea to ask around and get recommendations. You will then need to call the office and see if the doctor you have chosen will accept you as a new patient and what types of options they offer for patients who are self-pay. Some doctors offer discounts or will set up payment plans for their patients who do not have insurance, but you will need to ask so you aren't surprised when you get to your appointment. ° °2) Contact Your Local Health Department °Not all health departments have doctors that can see patients for sick visits, but many do, so it is worth a call to see if yours does. If you don't know where your local health department is, you can check in your phone book. The CDC also has a tool to help you locate your state's health department, and many state websites also have listings of all of their local health departments. ° °3) Find a Walk-in Clinic °If your illness is not likely to be very severe or complicated, you may want to try a walk in clinic. These are popping up all over the country in pharmacies, drugstores, and shopping centers. They're usually staffed by nurse practitioners or physician assistants that have been trained to treat common illnesses and complaints. They're usually fairly quick and inexpensive. However, if you have serious medical issues or chronic medical problems, these are probably not your best option. ° °No Primary Care Doctor: °- Call [facility] at  832-8000 - they can help you locate a primary care doctor that  accepts your  insurance, provides certain services, etc. °- Physician Referral Service- 1-800-533-3463 ° °Chronic Pain Problems: °Organization         Address  Phone   Notes  °[facility]  (336) 297-2271 Patients need to be referred by their primary care doctor.  ° °Medication Assistance: °Organization         Address  Phone   Notes  °[place] [facility] [address] °[place], [place] [address] (336) 641-8030 --Must be a resident of [place] °-- Must have NO insurance coverage whatsoever (no Medicaid/ Medicare, etc.) °-- The pt. MUST have a primary care doctor that directs their care regularly and follows them in the community °  °[facility]  (866) 331-1348   °[employer]  (888) 892-1162   ° °Agencies that provide inexpensive medical care: °Organization         Address  Phone   Notes  °[facility]  (336) 832-8035   °[facility]    (336) 832-7272   °[facility] [address] °[place], [place] [address] (336) 832-4777   °[facility] [address], °[place] (336) 271-4999   °[facility]    (336) 373-0678   °[facility]    (336) 272-1050   °[facility] ° [address], [place] Phone:  (336) 832-4444, Fax:  (336) 832-4440 Hours of Operation:  9 am -   6 pm, M-F.  Also accepts Medicaid/Medicare and self-pay.  °[facility] ° [address], [place] Phone: (336) 832-3150, Fax: (336) 832-3151. Hours of Operation:  8:30 am - 5:30 pm, M-F.  Also accepts Medicaid and self-pay.  °[facility] [place] [address], [place] Phone: (336) 878-6027   °[facility] [address], [place], [place] (336)723-1848, Ext. 123 Mondays & Thursdays: 7-9 AM.  First 15 patients are seen on a first come, first serve basis. °  ° °Medicaid-accepting [place] Providers: ° °Organization          Address  Phone   Notes  °[facility] [address], [place] (336) 641-2100 Also accepts self-pay patients.  °[facility] [address], [place] ° (336) 856-9996   °[facility] [address], [place] (336) 288-8857   °[facility] [address] [place] [address], [place] (336) 299-7000   °Veita Bland [address], [place]  ° (336) 373-1557 Only accepts [place] Access Medicaid patients after they have their name applied to their card.  ° °Self-Pay (no insurance) in [place]: ° °Organization         Address  Phone   Notes  °Sickle Cell Patients, [facility] [address], [place] (336) 832-1970   °[facility] [address], [place] (336) 832-4400   °[facility] ° [address], [place] (336) 992-4800   °[facility]/Dr. Osei-Bonsu ° [address] [place] [address], [place] or [address], [place] (336) 841-8500 Phone number for both [place] and [place] locations is the same.  °[facility] [address], [place] (336) 299-0000   °[facility] [address] [place] [address], [place] or [address] (336) 852-7530 °(336) 878-2260   °[facility] [address], [place] (336) 350-1642, phone; (336) 294-5005, fax Sees patients 1st and 3rd Saturday of every month.  Must not qualify for public or private insurance (i.e. Medicaid, Medicare, [place] Health Choice, Veterans' Benefits) • Household income should be no more than 200% of the poverty level •The clinic cannot treat you if you are pregnant or think you are pregnant • Sexually transmitted diseases are not treated at the clinic.  ° ° °Dental Care: °Organization         Address  Phone  Notes  °[facility] [address],  [place] (336) 641-6152 Accepts children up to age 21 who are enrolled in Medicaid or [place] Health Choice; pregnant women with a Medicaid card; and children who have applied for Medicaid or [place] Health Choice, but were declined, whose parents can pay a reduced fee at time of service.  °[facility] [place]  [address], [place] (336) 641-7733 Accepts children up to age 21 who are enrolled in Medicaid or [place] Health Choice; pregnant women with a Medicaid card; and children who have applied for Medicaid or  Health Choice, but were declined, whose parents can pay a reduced fee at time of service.  °[facility] ° [address], [place] (336) 641-4533 Patients are seen by appointment only. Walk-ins are not accepted. [facility] will see patients 18 years of age and   older. °Monday - Tuesday (8am-5pm) °Most Wednesdays (8:30-5pm) °$30 per visit, cash only  °[facility] ° [address], [place] (336) 641-4533 Patients are seen by appointment only. Walk-ins are not accepted. [facility] will see patients 18 years of age and older. °One Wednesday Evening (Monthly: Volunteer Based).  $30 per visit, cash only  °[facility]  (919) 537-3737 for adults; Children under age 4, call Graduate Pediatric Dentistry at ([phone]) [phone]. Children aged 4-14, please call ([phone]) [phone] to request a pediatric application. ° Dental services are provided in all areas of dental care including fillings, crowns and bridges, complete and partial dentures, implants, gum treatment, root canals, and extractions. Preventive care is also provided. Treatment is provided to both adults and children. °Patients are selected via a lottery and there is often a waiting list. °  °[facility] [address], °[place] ° (336) 763-8833 [email or web address] °  °[facility] [address], [place], [place]  (336)723-1848, Ext. 123 Second and Fourth Thursday of each month, opens at 6:30 AM; Clinic ends at 9 AM.  Patients are seen on a first-come first-served basis, and a limited number are seen during each clinic.  ° °[facility] ° [address], [place], [place] (336) 723-7904   Eligibility Requirements °You must have lived in [place], [place], or [place] counties for at least the last three months. °  You cannot be eligible for state or federal sponsored healthcare insurance, including Veterans Administration, Medicaid, or Medicare. °  You generally cannot be eligible for healthcare insurance through your employer.  °  How to apply: °Eligibility screenings are held every Tuesday and Wednesday afternoon from 1:00 pm until 4:00 pm. You do not need an appointment for the interview!  °[facility] [address], [place], [place] 336-631-2330   °[facility]  336-342-8273   °[facility]  336-703-3100   °[facility]  336-570-6415   ° °Behavioral Health Resources in the Community: °Intensive Outpatient Programs °Organization         Address  Phone  Notes  °[facility] [address], [place], [place] 336-878-6098   °[facility] [address], [place], [place] 336-832-9800   °[facility] [address], [place], [place] ° 336-882-2125   °[facility] [address],  °[place], [place] 1-800-853-5163 or 336-641-4981   °Substance Abuse Resources °Organization         Address  Phone  Notes  °[facility]  336-882-2125   °[facility]  336-784-9470   °[facility]  336-285-9073   °[facility]  336-845-3988   °Residential & Outpatient Substance Abuse Program  1-800-659-3381   °Psychological Services °Organization         Address  Phone  Notes  ° Health  336- 832-9600   °[facility]  336- 378-7881    °[facility] [address], [place] 1-800-853-5163 or 336-641-4981   ° °Mobile Crisis Teams °Organization         Address  Phone  Notes  °[facility], Mobile Crisis Care Unit  1-877-626-1772   °Assertive °[facility] ° [address] [place], [place] 336-834-9664   °Sharon DeEsch [address] °  336-554-5454   ° °Self-Help/Support Groups °Organization         Address    Phone             Notes  °[facility] - variety of support groups  336- 373-1402 Call for more information  °Narcotics Anonymous (NA), [facility] [address], °[place]  2 meetings at this location  ° °Residential Treatment Programs °Organization         Address  Phone  Notes  °[facility] [address],    °[place]  1-866-801-8205   °[facility] ° [address], [place], [place] 704-293-8524   °[facility] [address], [place] 336-845-3988 Admissions: 8am-3pm M-F  °[facility] [address]    °[place], [place] 336-841-1104   °[facility] [address], [place], [place] 336-379-7146   °[facility] [address]  °[place], [place] 336-285-9073   °[facility] Intensive Outpatient [address], [place], [place] 336-852-3033   °[facility] [address]  °[place], [place] 1-877-615-2722 or 336-784-9470   °[facility] [address] [place], [place] 336-227-7417 Accepts Medicaid  °[facility] [address]  °[place] 1-800-659-3381 Substance Abuse/Addiction Treatment  ° °[place] Behavioral Health Resources °Organization         Address  Phone  Notes  °[facility]  (888) 581-9988   °Julie Brannon, PhD [address] [place], [place]   (336) 349-5553 or (336) 951-0000   °[facility]   [address] °[place], [place] (336) 349-4454   °[facility] [address], [place], [place] (336) 342-8316 Insurance/Medicaid/sponsorship through [facility]  °[facility] [address]                                    [place], [place] (336) 342-8316 Therapy/tele-psych/case  °[facility] [address]  ° [place], [place] (336) 349-2233    °Dr. Arfeen  (336) 349-4544   °[facility] [place]  [employer] [place] [facility] 1) [address],  °2) [address], [place] °3)  [address], [place] (336) 349-3220 °(336) 342-7768 ° °(336) 342-8140   °[place] [facility] (336) 342-1394 or (336) 342-3537 (After Hours)    ° ° ° °

## 2014-06-08 NOTE — ED Notes (Signed)
Presents with sore throat began 2 days ago and is worse. Handling secretions, able to swallow. Denies fevers. No exudate noted. Throat red.

## 2014-06-08 NOTE — ED Notes (Signed)
Pt reports sore throat x2 days and trouble eating food due to pain. Pt denies cp/sob, fever, n/v/d.  Pt alert and oriented.

## 2014-06-09 ENCOUNTER — Telehealth (HOSPITAL_BASED_OUTPATIENT_CLINIC_OR_DEPARTMENT_OTHER): Payer: Self-pay | Admitting: Emergency Medicine

## 2014-06-09 LAB — HIV ANTIBODY (ROUTINE TESTING W REFLEX): HIV Screen 4th Generation wRfx: NONREACTIVE

## 2014-06-09 NOTE — Telephone Encounter (Signed)
Post ED Visit - Positive Culture Follow-up  Culture report reviewed by antimicrobial stewardship pharmacist: []  Wes Dulaney, Pharm.D., BCPS []  Celedonio MiyamotoJeremy Frens, [address] BCPS []  Georgina PillionElizabeth Martin, Pharm.D., BCPS []  MuleshoeMinh Pham, [address] BCPS, AAHIVP []  Estella HuskMichelle Turner, Pharm.D., BCPS, AAHIVP []  Elder CyphersLorie Poole, [address] BCPS  Positive chlamydia culture Treated with rocephin, zithromax, and doxycycline, organism sensitive to the same and no further patient follow-up is required at this time.  Berle MullMiller, Marcheta Horsey 06/09/2014, 11:57 AM

## 2014-06-10 LAB — CULTURE, GROUP A STREP

## 2014-06-12 LAB — RPR: RPR: NONREACTIVE

## 2014-09-02 ENCOUNTER — Encounter (HOSPITAL_COMMUNITY): Payer: Self-pay | Admitting: Emergency Medicine

## 2014-09-02 ENCOUNTER — Emergency Department (HOSPITAL_COMMUNITY)
Admission: EM | Admit: 2014-09-02 | Discharge: 2014-09-02 | Disposition: A | Discharge status: Home / Self Care | Payer: Medicaid Other | Attending: Emergency Medicine | Admitting: Emergency Medicine

## 2014-09-02 DIAGNOSIS — Z202 Contact with and (suspected) exposure to infections with a predominantly sexual mode of transmission: Secondary | ICD-10-CM | POA: Diagnosis not present

## 2014-09-02 DIAGNOSIS — Z3202 Encounter for pregnancy test, result negative: Secondary | ICD-10-CM | POA: Insufficient documentation

## 2014-09-02 DIAGNOSIS — N898 Other specified noninflammatory disorders of vagina: Secondary | ICD-10-CM | POA: Diagnosis not present

## 2014-09-02 DIAGNOSIS — R102 Pelvic and perineal pain: Secondary | ICD-10-CM | POA: Diagnosis present

## 2014-09-02 DIAGNOSIS — A64 Unspecified sexually transmitted disease: Secondary | ICD-10-CM

## 2014-09-02 LAB — URINALYSIS, ROUTINE W REFLEX MICROSCOPIC
BILIRUBIN URINE: NEGATIVE
Glucose, UA: NEGATIVE mg/dL
Ketones, ur: NEGATIVE mg/dL
NITRITE: NEGATIVE
PH: 7.5 (ref 5.0–8.0)
Protein, ur: NEGATIVE mg/dL
Specific Gravity, Urine: 1.029 (ref 1.005–1.030)
Urobilinogen, UA: 0.2 mg/dL (ref 0.0–1.0)

## 2014-09-02 LAB — WET PREP, GENITAL
Trich, Wet Prep: NONE SEEN
Yeast Wet Prep HPF POC: NONE SEEN

## 2014-09-02 LAB — POC URINE PREG, ED: Preg Test, Ur: NEGATIVE

## 2014-09-02 LAB — URINE MICROSCOPIC-ADD ON

## 2014-09-02 LAB — PREGNANCY, URINE: Preg Test, Ur: NEGATIVE

## 2014-09-02 MED ORDER — AZITHROMYCIN 250 MG PO TABS
1000.0000 mg | ORAL_TABLET | Freq: Once | ORAL | Status: AC
  Administered 2014-09-02: 1000 mg via ORAL
  Filled 2014-09-02: qty 4

## 2014-09-02 MED ORDER — DOXYCYCLINE HYCLATE 100 MG PO CAPS: 100.0000 mg | ORAL_CAPSULE | Freq: Two times a day (BID) | ORAL | Status: DC

## 2014-09-02 MED ORDER — CEFTRIAXONE SODIUM 250 MG IJ SOLR
250.0000 mg | Freq: Once | INTRAMUSCULAR | Status: AC
  Administered 2014-09-02: 250 mg via INTRAMUSCULAR
  Filled 2014-09-02: qty 250

## 2014-09-02 MED ORDER — ACYCLOVIR 400 MG PO TABS: 400.0000 mg | ORAL_TABLET | Freq: Three times a day (TID) | ORAL | Status: DC

## 2014-09-02 MED ORDER — LIDOCAINE HCL (PF) 1 % IJ SOLN
2.0000 mL | Freq: Once | INTRAMUSCULAR | Status: AC
  Administered 2014-09-02: 1 mL
  Filled 2014-09-02: qty 5

## 2014-09-02 NOTE — ED Notes (Signed)
Unable to locate pt in sub waiting

## 2014-09-02 NOTE — Discharge Instructions (Signed)
Please follow the directions provided. Use the resource guide or the referral given to establish care with a primary care doctor for further management. Please take the antibiotic as directed until it's all gone. Please take the antiviral medicine as directed until it's all gone. You should receive a phone call in a few days if any of your other tests are positive. The meantime as always please wear protection any time during intercourse. Don't hesitate to return for any new, worsening, or concerning symptoms.   SEEK MEDICAL CARE IF:  You have a recurrence of this infection.  You do not respond to medications and are not improving.  You have new sources of pain or discharge which have changed from the original infection.  You have an oral temperature above 102 F (38.9 C).  You develop abdominal pain.  You develop eye pain or signs of eye infection   Emergency Department Resource Guide 1) Find a Doctor and Pay Out of Pocket Although you won't have to find out who is covered by your insurance plan, it is a good idea to ask around and get recommendations. You will then need to call the office and see if the doctor you have chosen will accept you as a new patient and what types of options they offer for patients who are self-pay. Some doctors offer discounts or will set up payment plans for their patients who do not have insurance, but you will need to ask so you aren't surprised when you get to your appointment.  2) Contact Your Local Health Department Not all health departments have doctors that can see patients for sick visits, but many do, so it is worth a call to see if yours does. If you don't know where your local health department is, you can check in your phone book. The CDC also has a tool to help you locate your state's health department, and many state websites also have listings of all of their local health departments.  3) Find a Walk-in Clinic If your illness is not likely to be very  severe or complicated, you may want to try a walk in clinic. These are popping up all over the country in pharmacies, drugstores, and shopping centers. They're usually staffed by nurse practitioners or physician assistants that have been trained to treat common illnesses and complaints. They're usually fairly quick and inexpensive. However, if you have serious medical issues or chronic medical problems, these are probably not your best option.  No Primary Care Doctor: - Call Health Connect at  [phone] - they can help you locate a primary care doctor that  accepts your insurance, provides certain services, etc. - Physician Referral Service- [phone]  Chronic Pain Problems: Organization         Address  Phone   Notes  [facility]  [phone] Patients need to be referred by their primary care doctor.   Medication Assistance: Organization         Address  Phone   Notes  [facility] Medication [facility] [address] [place], [place] [address] [phone] --Must be a resident of [place] -- Must have NO insurance coverage whatsoever (no Medicaid/ Medicare, etc.) -- The pt. MUST have a primary care doctor that directs their care regularly and follows them in the community   Med[facility]ist  [phone]   [employer]  [phone]    Agencies that provide inexpensive medical care: Organization  Address  Phone   Notes  [place]  [phone]   [facility]    [phone]   [facility] [place], [place] [address] [phone]   [place] [address] [place]. [address], [place] [phone]   Planned Parenthood    [phone]   [facility]    [phone]   [place] and [place] [address], [place] Phone:  [phone], Fax:  [phone] Hours of Operation:  9 am - 6 pm, M-F.  Also accepts  Medicaid/Medicare and self-pay.  [facility] for [place], [address], [place] Phone: [phone], Fax: [phone]. Hours of Operation:  8:30 am - 5:30 pm, M-F.  Also accepts Medicaid and self-pay.  [facility] [place] [address] [place] Phone: [phone]   [address], [place], [place] [phone], Ext. [phone] Mondays & Thursdays: 7-9 AM.  First 15 patients are seen on a first come, first serve basis.    [place] Providers:  Organization         Address  Phone   Notes  [facility] [address] [place] [address], [place] [phone] Also accepts self-pay patients.  [facility] [place] [address], [place]  [phone]   [place], [address], [place] [phone]   [facility] [address] [place] [phone]   Lucianne Lei [address], [place]   [phone] Only accepts [place] Access [place] patients after they have their name applied to their card.   Self-Pay (no insurance) in [facility]:  Organization         Address  Phone   Notes  Sickle Cell Patients, [facility] [place] [phone]   [facility] [place] [phone]   [facility] [place]  [place], [place],  [phone]   [facility]/Dr. Osei-Bonsu  [address] [place] or [address], [place] [phone] Phone number for both [place] and [place] locations is the same.  Urgent Medical and [facility] [address], [place] [phone]   [facility] [address] [place] or [address] [phone]   [facility] [address], [place] [phone], phone; [phone], fax Sees patients 1st and 3rd Saturday of every month.  Must not qualify for public or private insurance (i.e. Medicaid, Medicare, [place] Health Choice, Veterans' Benefits)  Household income should be no more than 200% of the poverty level The clinic cannot treat you if you are pregnant or think you are pregnant  Sexually transmitted diseases are not treated at the clinic.    Dental Care: Organization         Address  Phone  Notes  [facility] [place] [phone] Accepts children up to age 83 who are enrolled in [place] or [place]; pregnant women with a Medicaid card; and children who have applied for Medicaid or [place] Health Choice, but were declined, whose parents can pay a reduced fee at time  of service.  [facility] Department of [employer] [facility]  [address], [place] [phone] Accepts children up to age 34 who are enrolled in [place] or [place]; pregnant women with a Medicaid card; and children who have applied for Medicaid or [place] Health Choice, but were declined, whose parents can pay a reduced fee at time of service.  [place] Adult Dental Access PROGRAM  [place] [phone] Patients are seen by appointment only. Walk-ins are not accepted. [place] will see patients 47 years of age and older. Monday - Tuesday (8am-5pm) Most Wednesdays (8:30-5pm) $30 per visit, cash only  [facility] Adult Dental Access PROGRAM  [address], [facility] [phone] Patients are seen by appointment only. Walk-ins are not accepted. [place] will see patients 77 years of age and older. One Wednesday Evening (Monthly: Volunteer Based).  $30 per visit, cash only  [place]  [phone] for adults; Children under age 30, call Graduate Pediatric Dentistry at [phone]. Children aged  3-14, please call [phone] to request a pediatric application.  Dental services are provided in all areas of dental care including fillings, crowns and bridges, complete and partial dentures, implants, gum treatment, root canals, and extractions. Preventive care is also provided. Treatment is provided to both adults and children. Patients are selected via a lottery and there is often a waiting list.   [facility] [address], [place]  [phone] [email or web address]   [facility] [address] [place], [place] [phone], Ext. [phone] Second and Fourth Thursday of each month, opens at 6:30 AM; Clinic ends at 9 AM.  Patients are seen on a first-come first-served basis, and a limited number are seen during each clinic.   [facility]  [address] Hillard D[place]er [place], [place] [phone]   Eligibility Requirements You must have lived in [place], [place], or [place] counties for at least the last three months.   You cannot be eligible for state or federal sponsored [employer], including [employer], [place], or [employer].   You generally cannot be eligible for healthcare insurance through your employer.    How to apply: Eligibility screenings are held every Tuesday and Wednesday afternoon from 1:00 pm until 4:00 pm. You do not need an appointment for the interview!  [facility] [address], [place], [place]   [place]  [place] Department  [place]  [phone]    Behavioral Health Resources in the Community: Intensive Outpatient Programs Organization         Address  Phone  Notes  [place]. [address], [place], [place] [phone]   [facility] Outpatient [address] [place], [place]   [facility]: [facility] [address]  [place], [place]   [place] [address]  [place], [place] or [phone]   Substance Abuse Resources Organization         Address  Phone  Notes  [facility] Drug Services  [phone]   [place]  [phone]   The [place]   Chinita Pester  [phone]   Residential & Outpatient Substance Abuse Program  [phone]   Psychological Services Organization         Address  Phone  Notes  [facility]  [phone]- [phone]   [facility]  [phone]- [phone]   [facility] [address], [place] [phone] or [phone]    Mobile Crisis Teams Organization         Address  Phone  Notes  [facility], Mobile Crisis Care Unit  [phone]   [facility]  [address] [place], [place] [phone]   Doristine LocksSharon DeEsch [address] [place] [phone]    Self-Help/Support Groups Organization         Address  Phone             Notes  [facility] [place] - variety of support groups  336- [phone] Call for more information  Narcotics Anonymous (NA), [facility] [address], [place]  2 meetings at this location   Statisticianesidential Treatment Programs Organization         Address  Phone  Notes  [facility] [address],    [place]  [phone]   [facility]  [address], [place] [phone], [place], [place] [phone]   [facility] [address], [place] [phone] Admissions: 8am-3pm M-F  [facility] [address]    [place], [place] [phone]   [facility] [address], [place], [place] [phone]   [facility] [address]  [place], [place] [phone]   [facility] - Intensive Outpatient [address], [place], [place] [phone]   [facility]  ([facility]iction Recovery [facility] [address]  [place], [place] [phone] or [phone]   [facility] (RTS) [address] [place], [place] [phone] Accepts Medicaid  [facility] [address]  [place] [phone] Substance Abuse/Addiction Treatment   [facility] Behavioral Health Resources Organization         Address  Phone  Notes  [facility]  [phone](888) [phone]   Angie FavaJulie Brannon, PhD [address], [address] [place], [place]   [phone]) [phone] or [phone](336) [phone]   [facility]   [address] [place], [place] [phone]) [phone]   [facility] [address], [place], [place] [phone](336) [phone] Insurance/Medicaid/sponsorship through [facility]  [facility] Families [address]                                    [place], [place] [phone]) [phone] Therapy/tele-psych/case  [facility] [address].   [place], [place] [phone]) [phone]    Dr. Lolly MustacheArfeen  [phone]) [phone]   [facility]  United Way [facility] 1) [address],  2) [address], [place] 3)  [address], [place] [phone]) [phone]  [phone]) [phone]   [facility] Child Abuse Hotline [phone]) [phone] or [phone](336) [phone] (After Hours)

## 2014-09-02 NOTE — ED Notes (Signed)
C/o vaginal pain and swelling x 2 days.  States pain when urine touches L side of vaginal area but denies pain with urinating.

## 2014-09-02 NOTE — ED Notes (Signed)
Ariana Lovelesshaperoned [name], PA with pelvic examination; vaginal specimens collected by Lanora ManisElizabeth, GeorgiaPA and then sent to lab for testing; pt tolerated procedure well

## 2014-09-02 NOTE — ED Provider Notes (Signed)
CSN: [id number]     Arrival date & time 09/02/14  1624 History  This chart was scribed for non-physician practitioner, Harle Battiest, NP, working with Shon Baton, MD, by Bronson Curb, ED Scribe. This patient was seen in room TR09C/TR09C and the patient's care was started at 6:05 PM.   Chief Complaint  Patient presents with  . Vaginal Pain    The history is provided by the patient. No language interpreter was used.     HPI Comments: Ariana Arellano is a 19 y.o. female, with no significant medical history, who presents to the Emergency Department complaining of left labia swelling for the past 2 days. Patient reports associated mild vaginal discharge. Patient is currently sexually active and denies history of prior similar episodes. She reports history of PID. She denies any pain/tednerness to the area, lesions, dysuria, or abdominal pain.   History reviewed. No pertinent past medical history. History reviewed. No pertinent past surgical history. No family history on file. History  Substance Use Topics  . Smoking status: Never Smoker   . Smokeless tobacco: Not on file  . Alcohol Use: No   OB History    Gravida Para Term Preterm AB TAB SAB Ectopic Multiple Living   1              Review of Systems  Constitutional: Negative for fever and chills.  HENT: Negative for sore throat.   Eyes: Negative for visual disturbance.  Respiratory: Negative for cough and shortness of breath.   Cardiovascular: Negative for chest pain and leg swelling.  Gastrointestinal: Negative for nausea, vomiting, abdominal pain and diarrhea.  Genitourinary: Positive for dysuria, vaginal discharge, genital sores and vaginal pain.  Musculoskeletal: Negative for myalgias.  Skin: Negative for rash.  Neurological: Negative for weakness, numbness and headaches.      Allergies  Review of patient's allergies indicates no known allergies.  Home Medications   Prior to Admission medications    Medication Sig Start Date End Date Taking? Authorizing Provider  ibuprofen (ADVIL,MOTRIN) 200 MG tablet Take 600 mg by mouth every 6 (six) hours as needed (pain).   Yes Historical Provider, MD  levonorgestrel (MIRENA) 20 MCG/24HR IUD 1 each by Intrauterine route once.   Yes Historical Provider, MD  doxycycline (VIBRAMYCIN) 100 MG capsule Take 1 capsule (100 mg total) by mouth 2 (two) times daily. One po bid x 7 days Patient not taking: Reported on 09/02/2014 06/06/14   Fayrene Helper, PA-C   Triage Vitals: BP 121/77 mmHg  Pulse 68  Temp(Src) 99 F (37.2 C)  Resp 18  Ht  (1.626 m)  Wt 108 lb (48.988 kg)  BMI 18.53 kg/m2  SpO2 100%  LMP 08/02/2014 (Approximate)  Physical Exam  Constitutional: She is oriented to person, place, and time. She appears well-developed and well-nourished. No distress.  HENT:  Head: Normocephalic and atraumatic.  Eyes: Conjunctivae and EOM are normal.  Neck: Neck supple. No tracheal deviation present.  Cardiovascular: Normal rate, regular rhythm and intact distal pulses.   Pulmonary/Chest: Effort normal and breath sounds normal. No respiratory distress. She has no wheezes. She has no rales. She exhibits no tenderness.  Abdominal: Soft. She exhibits no distension and no mass. There is no tenderness. There is no rebound and no guarding.  Genitourinary: There is lesion on the left labia. Uterus is not tender. Cervix exhibits no motion tenderness, no discharge and no friability. Right adnexum displays no tenderness. Left adnexum displays no tenderness. No vaginal discharge found.  Left  inner labia there is a small, white ulceration with mild swelling, but no abscess noted.  Musculoskeletal: Normal range of motion. She exhibits no tenderness.  Lymphadenopathy:    She has no cervical adenopathy.  Neurological: She is alert and oriented to person, place, and time.  Skin: Skin is warm and dry. No rash noted. She is not diaphoretic.  Psychiatric: She has a normal mood  and affect. Her behavior is normal.  Nursing note and vitals reviewed.   ED Course  Procedures (including critical care time)  DIAGNOSTIC STUDIES: Oxygen Saturation is 100% on room air, normal by my interpretation.    COORDINATION OF CARE: At 1809 Discussed treatment plan with patient which includes pelvic exam. Patient agrees.   Labs Review Labs Reviewed  WET PREP, GENITAL - Abnormal; Notable for the following:    Clue Cells Wet Prep HPF POC FEW (*)    WBC, Wet Prep HPF POC TOO NUMEROUS TO COUNT (*)    All other components within normal limits  URINALYSIS, ROUTINE W REFLEX MICROSCOPIC - Abnormal; Notable for the following:    APPearance HAZY (*)    Hgb urine dipstick MODERATE (*)    Leukocytes, UA MODERATE (*)    All other components within normal limits  URINE MICROSCOPIC-ADD ON - Abnormal; Notable for the following:    Squamous Epithelial / LPF FEW (*)    Bacteria, UA MANY (*)    All other components within normal limits  RPR  HIV ANTIBODY (ROUTINE TESTING)  HSV 1 ANTIBODY, IGG  HSV 2 ANTIBODY, IGG  PREGNANCY, URINE  POC URINE PREG, ED  POC URINE PREG, ED  GC/CHLAMYDIA PROBE AMP ([place])    Imaging Review No results found.   EKG Interpretation None      MDM   Final diagnoses:  STD (female)   19 yo with pain and swelling in left labia, with ulceration noted. No indication of abscess, consider genital herpes or chancre as cause of ulceration.   Pt has been treated prophylacticly with azithromycin and rocephin due to pts history, and wet prep with increased WBCs. Pt not concerning for PID because hemodynamically stable and no cervical motion tenderness on pelvic exam. Prescription for doxycycline and acyclovir provided.  Patient to be discharged with instructions to follow up with OBGYN. Discussed importance of using protection when sexually active. Pt understands that they have GC/Chlamydia cultures pending and that they will need to inform all sexual partners  if results return positive. Pt is well-appearing, in no acute distress and vital signs are stable.  They appear safe to be discharged.  Return precautions provided.  Pt aware of plan and in agreement.    I personally performed the services described in this documentation, which was scribed in my presence. The recorded information has been reviewed and is accurate.  Filed Vitals:   09/02/14 1644 09/02/14 2034  BP: 121/77 118/83  Pulse: 68 68  Temp: 99 F (37.2 C) 97.9 F (36.6 C)  TempSrc:  Oral  Resp: 18 18  Height:  (1.626 m)   Weight: 108 lb (48.988 kg)   SpO2: 100% 100%   Meds given in ED:  Medications  cefTRIAXone (ROCEPHIN) injection 250 mg (250 mg Intramuscular Given 09/02/14 2029)  azithromycin (ZITHROMAX) tablet 1,000 mg (1,000 mg Oral Given 09/02/14 2028)  lidocaine (PF) (XYLOCAINE) 1 % injection 2 mL (1 mL Other Given 09/02/14 2029)    Discharge Medication List as of 09/02/2014  8:53 PM    START taking  these medications   Details  acyclovir (ZOVIRAX) 400 MG tablet Take 1 tablet (400 mg total) by mouth 3 (three) times daily., Starting 09/02/2014, Until Discontinued, Print       09/02/14 0000  doxycycline (VIBRAMYCIN) 100 MG capsule 2 times daily Discontinue Reprint 09/02/14 2028   09/02/14 0000  acyclovir (ZOVIRAX) 400 MG tablet 3 times daily Discontinue Reprint 09/02/14 2028        Harle BattiestElizabeth Devereaux Grayson, NP 09/04/14 1737  Shon Batonourtney F Horton, MD 09/04/14 2001

## 2014-09-02 NOTE — ED Notes (Signed)
Pt c/o vaginal pain and swelling x 2 days. Also reports vaginal discharge. Reports increased pain on urination to L side of vagina. Pt getting undressed at this time

## 2014-09-03 LAB — HIV ANTIBODY (ROUTINE TESTING W REFLEX): HIV SCREEN 4TH GENERATION: NONREACTIVE

## 2014-09-03 LAB — GC/CHLAMYDIA PROBE AMP (~~LOC~~) NOT AT ARMC
CHLAMYDIA, DNA PROBE: NEGATIVE
NEISSERIA GONORRHEA: NEGATIVE

## 2014-09-03 LAB — HSV 1 ANTIBODY, IGG

## 2014-09-03 LAB — RPR: RPR Ser Ql: NONREACTIVE

## 2014-09-03 LAB — HSV 2 ANTIBODY, IGG

## 2015-02-21 ENCOUNTER — Encounter (HOSPITAL_COMMUNITY): Payer: Self-pay | Admitting: *Deleted

## 2015-02-21 ENCOUNTER — Emergency Department (HOSPITAL_COMMUNITY)
Admission: EM | Admit: 2015-02-21 | Discharge: 2015-02-21 | Disposition: A | Discharge status: Home / Self Care | Payer: Medicaid Other | Attending: Emergency Medicine | Admitting: Emergency Medicine

## 2015-02-21 DIAGNOSIS — Z792 Long term (current) use of antibiotics: Secondary | ICD-10-CM | POA: Insufficient documentation

## 2015-02-21 DIAGNOSIS — Z79899 Other long term (current) drug therapy: Secondary | ICD-10-CM | POA: Insufficient documentation

## 2015-02-21 DIAGNOSIS — L0231 Cutaneous abscess of buttock: Secondary | ICD-10-CM | POA: Diagnosis present

## 2015-02-21 DIAGNOSIS — L723 Sebaceous cyst: Secondary | ICD-10-CM

## 2015-02-21 DIAGNOSIS — L720 Epidermal cyst: Secondary | ICD-10-CM | POA: Diagnosis not present

## 2015-02-21 MED ORDER — LIDOCAINE-EPINEPHRINE (PF) 2 %-1:200000 IJ SOLN
20.0000 mL | Freq: Once | INTRAMUSCULAR | Status: AC
  Administered 2015-02-21: 20 mL
  Filled 2015-02-21: qty 20

## 2015-02-21 NOTE — ED Notes (Signed)
Pt in gown.

## 2015-02-21 NOTE — ED Provider Notes (Signed)
CSN: [id number]     Arrival date & time 02/21/15  1549 History  By signing my name below, I, [name], attest that this documentation has been prepared under the direction and in the presence of [name], PA-C. Electronically Signed: [name], ED Scribe. 02/21/2015. 5:00 PM.     Chief Complaint  Patient presents with  . Abscess   Patient is a 19 y.o. female presenting with abscess. The history is provided by the patient. No language interpreter was used.  Abscess Associated symptoms: no fever, no nausea and no vomiting    HPI Comments: Ariana Arellano is a 19 y.o. female who presents to the Emergency Department complaining of new abscess to right buttock 1 month prior. She states that the abscess never completely resolves. Pt states that it is painful when squeezing. Pt denies any drainage from area. Denies fever, chills, nausea or vomiting.   History reviewed. No pertinent past medical history. History reviewed. No pertinent past surgical history. History reviewed. No pertinent family history. Social History  Substance Use Topics  . Smoking status: Never Smoker   . Smokeless tobacco: None  . Alcohol Use: No   OB History    Gravida Para Term Preterm AB TAB SAB Ectopic Multiple Living   1              Review of Systems  Constitutional: Negative for fever and chills.  Gastrointestinal: Negative for nausea and vomiting.  Skin: Positive for rash (abscess).  All other systems reviewed and are negative.     Allergies  Review of patient's allergies indicates no known allergies.  Home Medications   Prior to Admission medications   Medication Sig Start Date End Date Taking? Authorizing Provider  acyclovir (ZOVIRAX) 400 MG tablet Take 1 tablet (400 mg total) by mouth 3 (three) times daily. 09/02/14   Harle Battiest, NP  doxycycline (VIBRAMYCIN) 100 MG capsule Take 1 capsule (100 mg total) by mouth 2 (two) times daily. 09/02/14   Harle Battiest, NP   ibuprofen (ADVIL,MOTRIN) 200 MG tablet Take 600 mg by mouth every 6 (six) hours as needed (pain).    Historical Provider, MD  levonorgestrel (MIRENA) 20 MCG/24HR IUD 1 each by Intrauterine route once.    Historical Provider, MD   BP 128/82 mmHg  Pulse 81  Temp(Src) 97.7 F (36.5 C) (Oral)  Resp 20  Ht  (1.626 m)  Wt 115 lb (52.164 kg)  BMI 19.73 kg/m2  SpO2 99%  LMP 01/31/2015   Physical Exam  Constitutional: She is oriented to person, place, and time. She appears well-developed and well-nourished. No distress.  HENT:  Head: Normocephalic and atraumatic.  Eyes: Conjunctivae and EOM are normal.  Neck: Neck supple. No tracheal deviation present.  Cardiovascular: Normal rate.   Pulmonary/Chest: Effort normal. No respiratory distress.  Musculoskeletal: Normal range of motion.  Neurological: She is alert and oriented to person, place, and time.  Skin: Skin is warm and dry.  Right inferior portion of left gluteus inflammed and tender cyst, no signs of infection or surrounding cellulitis, area is tender to palpation, no redness, heat or swelling.   Psychiatric: She has a normal mood and affect. Her behavior is normal.  Nursing note and vitals reviewed.   ED Course  Procedures (including critical care time) DIAGNOSTIC STUDIES: Oxygen Saturation is 99% on RA, normal by my interpretation.    COORDINATION OF CARE: 4:23 PM-Discussed treatment plan with pt at bedside and pt agreed to plan.  Labs Review Labs Reviewed - No data to display  Imaging Review No results found.    EKG Interpretation None      MDM   Cyst excision Performed by: [name], [name] Consent: Verbal consent obtained. Risks and benefits: risks, benefits and alternatives were discussed Type: abscess  Body area: R buttock  Anesthesia: local infiltration  Incision was made with a scalpel.  Local anesthetic: lidocaine 2% w/o epinephrine  Anesthetic total: 5 ml  Complexity:  complex  Drainage: purulent  Drainage amount: moderate, sebaceous  Packing material:   Patient tolerance: Patient tolerated the procedure well with no immediate complications. Incision closed with 5-0 Prolene, 4 simple interrupted sutures     Final diagnoses:  Inflamed epidermoid cyst of skin    Patient with apparent normal. Cyst of the right products, inflamed but without evidence of infection. I was able to obtain the entire cyst wall. Patient will return for suture removal in one week. Persistent for discharge at this time. Health care instructions given  I, [name], Lisha Vitale, personally performed the services described in this documentation. All medical record entries made by the scribe were at my direction and in my presence.  I have reviewed the chart and discharge instructions and agree that the record reflects my personal performance and is accurate and complete. [name], Eloy Fehl.  02/22/2015. 2:00 AM.         Arthor CaptainAbigail Justice Aguirre, PA-C 02/22/15 [id number]  Rolan BuccoMelanie Belfi, MD 02/22/15 [phone]

## 2015-02-21 NOTE — Discharge Instructions (Signed)
Sebaceous Cyst Removal Sebaceous cyst removal is a procedure to remove a sac of oily material that forms under your skin (sebaceous cyst). Sebaceous cysts may also be called epidermoid cysts or keratin cysts. Normally, the skin secretes this oily material through a gland or a hair follicle. This type of cyst usually results when a skin gland or hair follicle becomes blocked. You may need this procedure if you have a sebaceous cyst that becomes large, uncomfortable, or infected. LET [facility] PROVIDER KNOW ABOUT:  Any allergies you have.  All medicines you are taking, including vitamins, herbs, eye drops, creams, and over-the-counter medicines.  Previous problems you or members of your family have had with the use of anesthetics.  Any blood disorders you have.  Previous surgeries you have had.  Medical conditions you have. RISKS AND COMPLICATIONS Generally, this is a safe procedure. However, problems may occur, including:  Developing another cyst.  Bleeding.  Infection.  Scarring. BEFORE THE PROCEDURE  Ask your health care provider about:  Changing or stopping your regular medicines. This is especially important if you are taking diabetes medicines or blood thinners.  Taking medicines such as aspirin and ibuprofen. These medicines can thin your blood. Do not take these medicines before your procedure if your health care provider instructs you not to.  If you have an infected cyst, you may have to take antibiotic medicines before or after the cyst removal. Take your antibiotics as directed by your health care provider. Finish all of the medicine even if you start to feel better.  Take a shower on the morning of your procedure. Your health care provider may ask you to use a germ-killing (antiseptic) soap. PROCEDURE  You will be given a medicine that numbs the area (local anesthetic).  The skin around the cyst will be cleaned with a germ-killing solution  (antiseptic).  Your health care provider will make a small surgical incision over the cyst.  The cyst will be separated from the surrounding tissues that are under your skin.  If possible, the cyst will be removed undamaged (intact).  If the cyst bursts (ruptures), it will need to be removed in pieces.  After the cyst is removed, your health care provider will control any bleeding and close the incision with small stitches (sutures). Small incisions may not need sutures, and the bleeding will be controlled by applying direct pressure with gauze.  Your health care provider may apply antibiotic ointment and a light bandage (dressing) over the incision. This procedure may vary among health care providers and hospitals. AFTER THE PROCEDURE  If your cyst ruptured during surgery, you may need to take antibiotic medicine. If you were prescribed an antibiotic medicine, finish all of it even if you start to feel better.   This information is not intended to replace advice given to you by your health care provider. Make sure you discuss any questions you have with your health care provider.   Document Released: 04/21/2000 Document Revised: 05/15/2014 Document Reviewed: 01/07/2014 Elsevier Interactive Patient Education 2016 [employer]  Free HIV and STD Testing These locations offer FREE confidential testing for HIV, Chlamydia, Gonorrhea, and Syphilis. Non-Traditional Testing Sites Address Telephone  [facility] [address], [place] [phone] Mondays 5pm - 7pm  [facility] [address] [place] [phone] Wednesdays 2pm-8pm  [facility] [address], [place] [phone] Thursdays 9am-12noon 1pm-4pm  [facility] [address] [place] [phone] Tuesdays Thursdays 9am-12noon 1pm-4pm  [facility] [employer] offers free, confidential testing and treatment for HIV, Chlamydia, Gonorrhea, Syphilis, Herpes, Bacterial Vaginosis, Yeast, and Trichomoniasis. Traditional Testing   [facility] [facility] [address], [place] [phone]  Monday thru Friday  Call for an appointment  [facility] [address] [place] [phone] Monday thru Friday  Call for anappointment.  If you have any questions about this information please call [phone]. 03/16/2011

## 2015-02-21 NOTE — ED Notes (Signed)
Pt reports having abscess to right buttock, denies fever.

## 2015-02-21 NOTE — ED Notes (Signed)
Declined W/C at D/C and was escorted to lobby by RN. 

## 2015-03-02 ENCOUNTER — Encounter (HOSPITAL_COMMUNITY): Payer: Self-pay | Admitting: Emergency Medicine

## 2015-03-02 ENCOUNTER — Emergency Department (HOSPITAL_COMMUNITY)
Admission: EM | Admit: 2015-03-02 | Discharge: 2015-03-02 | Disposition: A | Discharge status: Home / Self Care | Payer: Medicaid Other | Attending: Physician Assistant | Admitting: Physician Assistant

## 2015-03-02 DIAGNOSIS — B9689 Other specified bacterial agents as the cause of diseases classified elsewhere: Secondary | ICD-10-CM

## 2015-03-02 DIAGNOSIS — Z793 Long term (current) use of hormonal contraceptives: Secondary | ICD-10-CM | POA: Diagnosis not present

## 2015-03-02 DIAGNOSIS — N760 Acute vaginitis: Secondary | ICD-10-CM | POA: Insufficient documentation

## 2015-03-02 DIAGNOSIS — Z4802 Encounter for removal of sutures: Secondary | ICD-10-CM | POA: Diagnosis not present

## 2015-03-02 LAB — URINALYSIS, ROUTINE W REFLEX MICROSCOPIC
BILIRUBIN URINE: NEGATIVE
Glucose, UA: NEGATIVE mg/dL
Hgb urine dipstick: NEGATIVE
KETONES UR: NEGATIVE mg/dL
Leukocytes, UA: NEGATIVE
NITRITE: NEGATIVE
Protein, ur: NEGATIVE mg/dL
Specific Gravity, Urine: 1.024 (ref 1.005–1.030)
UROBILINOGEN UA: 0.2 mg/dL (ref 0.0–1.0)
pH: 5.5 (ref 5.0–8.0)

## 2015-03-02 LAB — WET PREP, GENITAL
TRICH WET PREP: NONE SEEN
YEAST WET PREP: NONE SEEN

## 2015-03-02 LAB — POC URINE PREG, ED: PREG TEST UR: NEGATIVE

## 2015-03-02 MED ORDER — METRONIDAZOLE 500 MG PO TABS: 500.0000 mg | ORAL_TABLET | Freq: Two times a day (BID) | ORAL | Status: DC

## 2015-03-02 NOTE — ED Notes (Signed)
Pelvic cart set up at the bedside.  

## 2015-03-02 NOTE — ED Provider Notes (Signed)
CSN: [id number]     Arrival date & time 03/02/15  1747 History   First MD Initiated Contact with Patient 03/02/15 2146     Chief Complaint  Patient presents with  . Vaginal Discharge  . Suture / Staple Removal     (Consider location/radiation/quality/duration/timing/severity/associated sxs/prior Treatment) Patient is a 19 y.o. female presenting with vaginal discharge. The history is provided by the patient. No language interpreter was used.  Vaginal Discharge Quality:  Malodorous Severity:  Moderate Onset quality:  Sudden Duration:  3 days Timing:  Constant Progression:  Unchanged Context: not during urination   Relieved by:  Nothing Worsened by:  Nothing tried Ineffective treatments:  None tried Risk factors: STI     History reviewed. No pertinent past medical history. History reviewed. No pertinent past surgical history. History reviewed. No pertinent family history. Social History  Substance Use Topics  . Smoking status: Never Smoker   . Smokeless tobacco: None  . Alcohol Use: No   OB History    Gravida Para Term Preterm AB TAB SAB Ectopic Multiple Living   1              Review of Systems  Genitourinary: Positive for vaginal discharge.  All other systems reviewed and are negative.     Allergies  Review of patient's allergies indicates no known allergies.  Home Medications   Prior to Admission medications   Medication Sig Start Date End Date Taking? Authorizing Provider  ibuprofen (ADVIL,MOTRIN) 200 MG tablet Take 600 mg by mouth every 6 (six) hours as needed (pain).   Yes Historical Provider, MD  levonorgestrel (MIRENA) 20 MCG/24HR IUD 1 each by Intrauterine route once.   Yes Historical Provider, MD   BP 112/58 mmHg  Pulse 87  Temp(Src) 98.7 F (37.1 C) (Oral)  Resp 16  SpO2 100%  LMP 01/31/2015 Physical Exam  Constitutional: She is oriented to person, place, and time. She appears well-developed and well-nourished.  Cardiovascular: Normal rate  and regular rhythm.   Pulmonary/Chest: Breath sounds normal.  Abdominal: Soft. Bowel sounds are normal. There is no tenderness.  Genitourinary:  White discharge. -cmt  Musculoskeletal: Normal range of motion.  Neurological: She is alert and oriented to person, place, and time.  Skin:  Well healing wound to the right buttock. No drainage or redness noted  Nursing note and vitals reviewed.   ED Course  .Suture Removal Date/Time: 03/02/2015 10:45 PM Performed by: Teressa LowerPICKERING, Unique Searfoss Authorized by: Teressa LowerPICKERING, Edita Weyenberg Consent given by: patient Patient identity confirmed: verbally with patient Time out: Immediately prior to procedure a "time out" was called to verify the correct patient, procedure, equipment, support staff and site/side marked as required. Location: right buttock. Wound Appearance: clean Sutures Removed: 4 Facility: sutures placed in this facility Patient tolerance: Patient tolerated the procedure well with no immediate complications   (including critical care time) Labs Review Labs Reviewed  WET PREP, GENITAL - Abnormal; Notable for the following:    Clue Cells Wet Prep HPF POC FEW (*)    WBC, Wet Prep HPF POC FEW (*)    All other components within normal limits  URINALYSIS, ROUTINE W REFLEX MICROSCOPIC (NOT AT [facility])  HIV ANTIBODY (ROUTINE TESTING)  POC URINE PREG, ED  GC/CHLAMYDIA PROBE AMP ([place]) NOT AT [facility]    Imaging Review No results found. I have personally reviewed and evaluated these images and lab results as part of my medical decision-making.   EKG Interpretation None      MDM  Final diagnoses:  Visit for suture removal  BV (bacterial vaginosis)    Sutures removed. No sign of infection. Will treat for bv. Std cultures sent. Discussed safe sex practices with pt    Teressa Lower, NP 03/02/15 2316  Courteney Randall An, MD 03/03/15 1610
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
## 2015-03-02 NOTE — Discharge Instructions (Signed)
Bacterial Vaginosis Bacterial vaginosis is a vaginal infection that occurs when the normal balance of bacteria in the vagina is disrupted. It results from an overgrowth of certain bacteria. This is the most common vaginal infection in women of childbearing age. Treatment is important to prevent complications, especially in pregnant women, as it can cause a premature delivery. CAUSES  Bacterial vaginosis is caused by an increase in harmful bacteria that are normally present in smaller amounts in the vagina. Several different kinds of bacteria can cause bacterial vaginosis. However, the reason that the condition develops is not fully understood. RISK FACTORS Certain activities or behaviors can put you at an increased risk of developing bacterial vaginosis, including:  Having a new sex partner or multiple sex partners.  Douching.  Using an intrauterine device (IUD) for contraception. Women do not get bacterial vaginosis from toilet seats, bedding, swimming pools, or contact with objects around them. SIGNS AND SYMPTOMS  Some women with bacterial vaginosis have no signs or symptoms. Common symptoms include:  Grey vaginal discharge.  A fishlike odor with discharge, especially after sexual intercourse.  Itching or burning of the vagina and vulva.  Burning or pain with urination. DIAGNOSIS  Your health care provider will take a medical history and examine the vagina for signs of bacterial vaginosis. A sample of vaginal fluid may be taken. Your health care provider will look at this sample under a microscope to check for bacteria and abnormal cells. A vaginal pH test may also be done.  TREATMENT  Bacterial vaginosis may be treated with antibiotic medicines. These may be given in the form of a pill or a vaginal cream. A second round of antibiotics may be prescribed if the condition comes back after treatment. Because bacterial vaginosis increases your risk for sexually transmitted diseases, getting  treated can help reduce your risk for chlamydia, gonorrhea, HIV, and herpes. HOME CARE INSTRUCTIONS   Only take over-the-counter or prescription medicines as directed by your health care provider.  If antibiotic medicine was prescribed, take it as directed. Make sure you finish it even if you start to feel better.  Tell all sexual partners that you have a vaginal infection. They should see their health care provider and be treated if they have problems, such as a mild rash or itching.  During treatment, it is important that you follow these instructions:  Avoid sexual activity or use condoms correctly.  Do not douche.  Avoid alcohol as directed by your health care provider.  Avoid breastfeeding as directed by your health care provider. SEEK MEDICAL CARE IF:   Your symptoms are not improving after 3 days of treatment.  You have increased discharge or pain.  You have a fever. MAKE SURE YOU:   Understand these instructions.  Will watch your condition.  Will get help right away if you are not doing well or get worse. FOR MORE INFORMATION  Centers for Disease Control and Prevention, Division of STD Prevention: SolutionApps.co.zawww.cdc.gov/std American Sexual Health Association (ASHA): www.ashastd.org    This information is not intended to replace advice given to you by your health care provider. Make sure you discuss any questions you have with your health care provider.   Document Released: 04/24/2005 Document Revised: 05/15/2014 Document Reviewed: 12/04/2012 Elsevier Interactive Patient Education 2016 Elsevier Inc.  Suture Removal, Care After Refer to this sheet in the next few weeks. These instructions provide you with information on caring for yourself after your procedure. Your health care provider may also give you more specific  instructions. Your treatment has been planned according to current medical practices, but problems sometimes occur. Call your health care provider if you have any  problems or questions after your procedure. WHAT TO EXPECT AFTER THE PROCEDURE After your stitches (sutures) are removed, it is typical to have the following:  Some discomfort and swelling in the wound area.  Slight redness in the area. HOME CARE INSTRUCTIONS   If you have skin adhesive strips over the wound area, do not take the strips off. They will fall off on their own in a few days. If the strips remain in place after 14 days, you may remove them.  Change any bandages (dressings) at least once a day or as directed by your health care provider. If the bandage sticks, soak it off with warm, soapy water.  Apply cream or ointment only as directed by your health care provider. If using cream or ointment, wash the area with soap and water 2 times a day to remove all the cream or ointment. Rinse off the soap and pat the area dry with a clean towel.  Keep the wound area dry and clean. If the bandage becomes wet or dirty, or if it develops a bad smell, change it as soon as possible.  Continue to protect the wound from injury.  Use sunscreen when out in the sun. New scars become sunburned easily. SEEK MEDICAL CARE IF:  You have increasing redness, swelling, or pain in the wound.  You see pus coming from the wound.  You have a fever.  You notice a bad smell coming from the wound or dressing.  Your wound breaks open (edges not staying together).   This information is not intended to replace advice given to you by your health care provider. Make sure you discuss any questions you have with your health care provider.   Document Released: 01/17/2001 Document Revised: 02/12/2013 Document Reviewed: 12/04/2012 Elsevier Interactive Patient Education [employer]

## 2015-03-03 LAB — HIV ANTIBODY (ROUTINE TESTING W REFLEX): HIV Screen 4th Generation wRfx: NONREACTIVE

## 2015-03-03 LAB — GC/CHLAMYDIA PROBE AMP (~~LOC~~) NOT AT ARMC
CHLAMYDIA, DNA PROBE: NEGATIVE
NEISSERIA GONORRHEA: NEGATIVE

## 2016-02-02 ENCOUNTER — Encounter: Payer: Self-pay | Admitting: *Deleted

## 2021-01-18 DIAGNOSIS — Z01419 Encounter for gynecological examination (general) (routine) without abnormal findings: Secondary | ICD-10-CM | POA: Diagnosis not present

## 2021-01-18 DIAGNOSIS — Z113 Encounter for screening for infections with a predominantly sexual mode of transmission: Secondary | ICD-10-CM | POA: Diagnosis not present

## 2021-01-18 DIAGNOSIS — Z114 Encounter for screening for human immunodeficiency virus [HIV]: Secondary | ICD-10-CM | POA: Diagnosis not present

## 2021-03-04 DIAGNOSIS — N921 Excessive and frequent menstruation with irregular cycle: Secondary | ICD-10-CM | POA: Diagnosis not present

## 2021-03-04 DIAGNOSIS — R5383 Other fatigue: Secondary | ICD-10-CM | POA: Diagnosis not present

## 2021-03-04 DIAGNOSIS — N926 Irregular menstruation, unspecified: Secondary | ICD-10-CM | POA: Diagnosis not present

## 2021-03-13 ENCOUNTER — Other Ambulatory Visit: Payer: Self-pay

## 2021-03-13 ENCOUNTER — Emergency Department (HOSPITAL_COMMUNITY): Payer: Medicaid Other

## 2021-03-13 ENCOUNTER — Encounter (HOSPITAL_COMMUNITY): Payer: Self-pay | Admitting: Emergency Medicine

## 2021-03-13 ENCOUNTER — Emergency Department (HOSPITAL_COMMUNITY)
Admission: EM | Admit: 2021-03-13 | Discharge: 2021-03-13 | Disposition: A | Discharge status: Home / Self Care | Payer: Medicaid Other | Attending: Emergency Medicine | Admitting: Emergency Medicine

## 2021-03-13 DIAGNOSIS — Y92410 Unspecified street and highway as the place of occurrence of the external cause: Secondary | ICD-10-CM | POA: Insufficient documentation

## 2021-03-13 DIAGNOSIS — M546 Pain in thoracic spine: Secondary | ICD-10-CM | POA: Diagnosis not present

## 2021-03-13 DIAGNOSIS — M25562 Pain in left knee: Secondary | ICD-10-CM | POA: Diagnosis not present

## 2021-03-13 DIAGNOSIS — M545 Low back pain, unspecified: Secondary | ICD-10-CM | POA: Diagnosis not present

## 2021-03-13 DIAGNOSIS — S8992XA Unspecified injury of left lower leg, initial encounter: Secondary | ICD-10-CM | POA: Diagnosis not present

## 2021-03-13 MED ORDER — IBUPROFEN 400 MG PO TABS
600.0000 mg | ORAL_TABLET | Freq: Once | ORAL | Status: AC
  Administered 2021-03-13: 600 mg via ORAL
  Filled 2021-03-13: qty 1
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
## 2021-03-13 NOTE — Discharge Instructions (Signed)
Return for any problem.  Use ibuprofen - 600 mg taken every 8 hours - as instructed for treatment of muscular pain.
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
## 2021-03-14 ENCOUNTER — Telehealth: Payer: Self-pay

## 2021-03-14 NOTE — Telephone Encounter (Signed)
Transition Care Management Follow-up Telephone Call Date of discharge and from where: 03/13/2021 from [facility] How have you been since you were released from the hospital? Pt stated that she is feeling a little sore but over all okay.  Any questions or concerns? No  Items Reviewed: Did the pt receive and understand the discharge instructions provided? Yes  Medications obtained and verified? Yes  Other? No  Any new allergies since your discharge? No  Dietary orders reviewed? No Do you have support at home? Yes   Functional Questionnaire: (I = Independent and D = Dependent) ADLs: I  Bathing/Dressing- I  Meal Prep- I  Eating- I  Maintaining continence- I  Transferring/Ambulation- I  Managing Meds- I   Follow up appointments reviewed:  PCP Hospital f/u appt confirmed? No   Specialist Hospital f/u appt confirmed? No   Are transportation arrangements needed? No  If their condition worsens, is the pt aware to call PCP or go to the Emergency Dept.? Yes Was the patient provided with contact information for the PCP's office or ED? Yes Was to pt encouraged to call back with questions or concerns? Yes

## 2021-03-16 DIAGNOSIS — N926 Irregular menstruation, unspecified: Secondary | ICD-10-CM | POA: Diagnosis not present

## 2021-03-16 DIAGNOSIS — N921 Excessive and frequent menstruation with irregular cycle: Secondary | ICD-10-CM | POA: Diagnosis not present

## 2021-07-04 DIAGNOSIS — R7989 Other specified abnormal findings of blood chemistry: Secondary | ICD-10-CM | POA: Diagnosis not present

## 2021-07-04 DIAGNOSIS — E559 Vitamin D deficiency, unspecified: Secondary | ICD-10-CM | POA: Diagnosis not present

## 2021-07-04 DIAGNOSIS — N938 Other specified abnormal uterine and vaginal bleeding: Secondary | ICD-10-CM | POA: Diagnosis not present

## 2021-07-04 DIAGNOSIS — R5383 Other fatigue: Secondary | ICD-10-CM | POA: Diagnosis not present

## 2021-07-04 DIAGNOSIS — N83202 Unspecified ovarian cyst, left side: Secondary | ICD-10-CM | POA: Diagnosis not present

## 2021-07-04 DIAGNOSIS — N926 Irregular menstruation, unspecified: Secondary | ICD-10-CM | POA: Diagnosis not present

## 2021-07-04 DIAGNOSIS — N83209 Unspecified ovarian cyst, unspecified side: Secondary | ICD-10-CM | POA: Diagnosis not present

## 2021-07-04 DIAGNOSIS — N921 Excessive and frequent menstruation with irregular cycle: Secondary | ICD-10-CM | POA: Diagnosis not present

## 2021-07-18 DIAGNOSIS — N938 Other specified abnormal uterine and vaginal bleeding: Secondary | ICD-10-CM | POA: Diagnosis not present

## 2021-07-18 DIAGNOSIS — R7989 Other specified abnormal findings of blood chemistry: Secondary | ICD-10-CM | POA: Diagnosis not present

## 2021-07-18 DIAGNOSIS — R5383 Other fatigue: Secondary | ICD-10-CM | POA: Diagnosis not present

## 2021-07-18 DIAGNOSIS — N926 Irregular menstruation, unspecified: Secondary | ICD-10-CM | POA: Diagnosis not present

## 2021-07-18 DIAGNOSIS — D72819 Decreased white blood cell count, unspecified: Secondary | ICD-10-CM | POA: Diagnosis not present

## 2021-07-18 DIAGNOSIS — N921 Excessive and frequent menstruation with irregular cycle: Secondary | ICD-10-CM | POA: Diagnosis not present

## 2021-08-31 DIAGNOSIS — D649 Anemia, unspecified: Secondary | ICD-10-CM | POA: Diagnosis not present

## 2021-09-08 DIAGNOSIS — D649 Anemia, unspecified: Secondary | ICD-10-CM | POA: Diagnosis not present

## 2021-10-27 DIAGNOSIS — N912 Amenorrhea, unspecified: Secondary | ICD-10-CM | POA: Diagnosis not present

## 2021-10-27 DIAGNOSIS — R110 Nausea: Secondary | ICD-10-CM | POA: Diagnosis not present

## 2021-10-28 ENCOUNTER — Encounter (HOSPITAL_COMMUNITY): Payer: Self-pay

## 2021-10-28 ENCOUNTER — Inpatient Hospital Stay (HOSPITAL_COMMUNITY): Payer: Medicaid Other

## 2021-10-28 ENCOUNTER — Inpatient Hospital Stay (HOSPITAL_COMMUNITY)
Admission: AD | Admit: 2021-10-28 | Discharge: 2021-10-28 | Disposition: A | Discharge status: Home / Self Care | Payer: Medicaid Other | Attending: Family Medicine | Admitting: Family Medicine

## 2021-10-28 DIAGNOSIS — N760 Acute vaginitis: Secondary | ICD-10-CM

## 2021-10-28 DIAGNOSIS — B9689 Other specified bacterial agents as the cause of diseases classified elsewhere: Secondary | ICD-10-CM | POA: Diagnosis not present

## 2021-10-28 DIAGNOSIS — O26851 Spotting complicating pregnancy, first trimester: Secondary | ICD-10-CM | POA: Diagnosis not present

## 2021-10-28 DIAGNOSIS — Z3A09 9 weeks gestation of pregnancy: Secondary | ICD-10-CM | POA: Diagnosis not present

## 2021-10-28 DIAGNOSIS — Z3491 Encounter for supervision of normal pregnancy, unspecified, first trimester: Secondary | ICD-10-CM | POA: Diagnosis not present

## 2021-10-28 DIAGNOSIS — O209 Hemorrhage in early pregnancy, unspecified: Secondary | ICD-10-CM | POA: Diagnosis not present

## 2021-10-28 DIAGNOSIS — O26891 Other specified pregnancy related conditions, first trimester: Secondary | ICD-10-CM | POA: Diagnosis not present

## 2021-10-28 DIAGNOSIS — Z6791 Unspecified blood type, Rh negative: Secondary | ICD-10-CM

## 2021-10-28 DIAGNOSIS — O23591 Infection of other part of genital tract in pregnancy, first trimester: Secondary | ICD-10-CM | POA: Insufficient documentation

## 2021-10-28 DIAGNOSIS — N939 Abnormal uterine and vaginal bleeding, unspecified: Secondary | ICD-10-CM | POA: Diagnosis not present

## 2021-10-28 HISTORY — DX: Anemia, unspecified: D64.9

## 2021-10-28 LAB — URINALYSIS, ROUTINE W REFLEX MICROSCOPIC
Bilirubin Urine: NEGATIVE
Glucose, UA: NEGATIVE mg/dL
Hgb urine dipstick: NEGATIVE
Ketones, ur: NEGATIVE mg/dL
Leukocytes,Ua: NEGATIVE
Nitrite: NEGATIVE
Protein, ur: NEGATIVE mg/dL
Specific Gravity, Urine: 1.015 (ref 1.005–1.030)
pH: 6 (ref 5.0–8.0)

## 2021-10-28 LAB — HCG, QUANTITATIVE, PREGNANCY: hCG, Beta Chain, Quant, S: 151476 m[IU]/mL — ABNORMAL HIGH (ref <5)

## 2021-10-28 LAB — RH IG WORKUP (INCLUDES ABO/RH): Unit division: 0

## 2021-10-28 LAB — CBC
HCT: 38.1 % (ref 36.0–46.0)
Hemoglobin: 12.9 g/dL (ref 12.0–15.0)
MCH: 29.3 pg (ref 26.0–34.0)
MCHC: 33.9 g/dL (ref 30.0–36.0)
MCV: 86.4 fL (ref 80.0–100.0)
Platelets: 298 10*3/uL (ref 150–400)
RBC: 4.41 MIL/uL (ref 3.87–5.11)
RDW: 16.8 % — ABNORMAL HIGH (ref 11.5–15.5)
WBC: 4.6 10*3/uL (ref 4.0–10.5)
nRBC: 0 % (ref 0.0–0.2)

## 2021-10-28 LAB — WET PREP, GENITAL
Sperm: NONE SEEN
Trich, Wet Prep: NONE SEEN
WBC, Wet Prep HPF POC: >=10 — AB (ref <10)
Yeast Wet Prep HPF POC: NONE SEEN

## 2021-10-28 LAB — POCT PREGNANCY, URINE: Preg Test, Ur: POSITIVE — AB

## 2021-10-28 MED ORDER — METRONIDAZOLE 500 MG PO TABS: 500.0000 mg | ORAL_TABLET | Freq: Two times a day (BID) | ORAL | 0 refills | Status: DC

## 2021-10-28 MED ORDER — RHO D IMMUNE GLOBULIN 1500 UNIT/2ML IJ SOSY
300.0000 ug | PREFILLED_SYRINGE | Freq: Once | INTRAMUSCULAR | Status: AC
  Administered 2021-10-28: 300 ug via INTRAMUSCULAR
  Filled 2021-10-28: qty 2

## 2021-10-28 NOTE — MAU Note (Signed)
.  Ariana Arellano is a 26 y.o. at Unknown here in MAU reporting: brownish red VB spotting and leaking clear watery fluid since 1830 today. Pt states the watery discharge she noticed with the bleeding. Pt not wearing a pad. Pt denies cramping, clotting, and abnormal discharge prior. Pt states last intercourse was last night.  LMP: 08/21/2021 Onset of complaint: 1830 Pain score: 0/10 Vitals:   10/28/21 1943  BP: 113/82  Pulse: 64  Resp: 18  Temp: 98.2 F (36.8 C)  SpO2: 100%      Lab orders placed from triage:  UA

## 2021-10-29 LAB — RH IG WORKUP (INCLUDES ABO/RH)
ABO/RH(D): B NEG
Antibody Screen: NEGATIVE
Gestational Age(Wks): 9

## 2021-10-31 LAB — GC/CHLAMYDIA PROBE AMP (~~LOC~~) NOT AT ARMC
Chlamydia: NEGATIVE
Comment: NEGATIVE
Comment: NORMAL
Neisseria Gonorrhea: NEGATIVE

## 2021-11-10 DIAGNOSIS — Z3490 Encounter for supervision of normal pregnancy, unspecified, unspecified trimester: Secondary | ICD-10-CM | POA: Diagnosis not present

## 2021-11-10 DIAGNOSIS — R7989 Other specified abnormal findings of blood chemistry: Secondary | ICD-10-CM | POA: Diagnosis not present

## 2021-11-11 DIAGNOSIS — Z3490 Encounter for supervision of normal pregnancy, unspecified, unspecified trimester: Secondary | ICD-10-CM | POA: Diagnosis not present

## 2021-12-08 DIAGNOSIS — R7989 Other specified abnormal findings of blood chemistry: Secondary | ICD-10-CM | POA: Diagnosis not present

## 2021-12-08 DIAGNOSIS — Z3490 Encounter for supervision of normal pregnancy, unspecified, unspecified trimester: Secondary | ICD-10-CM | POA: Diagnosis not present

## 2022-01-05 DIAGNOSIS — Z363 Encounter for antenatal screening for malformations: Secondary | ICD-10-CM | POA: Diagnosis not present

## 2022-01-18 ENCOUNTER — Ambulatory Visit
Admission: EM | Admit: 2022-01-18 | Discharge: 2022-01-18 | Disposition: A | Discharge status: Other Acute Inpatient Hospital | Payer: Medicaid Other

## 2022-01-18 DIAGNOSIS — Z6791 Unspecified blood type, Rh negative: Secondary | ICD-10-CM | POA: Diagnosis not present

## 2022-01-18 DIAGNOSIS — Z112 Encounter for screening for other bacterial diseases: Secondary | ICD-10-CM | POA: Diagnosis not present

## 2022-01-18 DIAGNOSIS — O26899 Other specified pregnancy related conditions, unspecified trimester: Secondary | ICD-10-CM | POA: Diagnosis not present

## 2022-01-18 DIAGNOSIS — R7989 Other specified abnormal findings of blood chemistry: Secondary | ICD-10-CM | POA: Diagnosis not present

## 2022-01-18 DIAGNOSIS — Z118 Encounter for screening for other infectious and parasitic diseases: Secondary | ICD-10-CM | POA: Diagnosis not present

## 2022-01-20 ENCOUNTER — Inpatient Hospital Stay (HOSPITAL_COMMUNITY)
Admission: AD | Admit: 2022-01-20 | Discharge: 2022-01-20 | Disposition: A | Discharge status: Home / Self Care | Payer: Medicaid Other | Attending: Obstetrics & Gynecology | Admitting: Obstetrics & Gynecology

## 2022-01-20 ENCOUNTER — Encounter (HOSPITAL_COMMUNITY): Payer: Self-pay | Admitting: Obstetrics & Gynecology

## 2022-01-20 DIAGNOSIS — O26892 Other specified pregnancy related conditions, second trimester: Secondary | ICD-10-CM | POA: Insufficient documentation

## 2022-01-20 DIAGNOSIS — R102 Pelvic and perineal pain: Secondary | ICD-10-CM | POA: Diagnosis not present

## 2022-01-20 DIAGNOSIS — M545 Low back pain, unspecified: Secondary | ICD-10-CM | POA: Insufficient documentation

## 2022-01-20 DIAGNOSIS — W108XXA Fall (on) (from) other stairs and steps, initial encounter: Secondary | ICD-10-CM | POA: Diagnosis not present

## 2022-01-20 DIAGNOSIS — Z3A21 21 weeks gestation of pregnancy: Secondary | ICD-10-CM | POA: Insufficient documentation

## 2022-01-20 DIAGNOSIS — W19XXXA Unspecified fall, initial encounter: Secondary | ICD-10-CM | POA: Diagnosis not present

## 2022-01-20 DIAGNOSIS — Z3492 Encounter for supervision of normal pregnancy, unspecified, second trimester: Secondary | ICD-10-CM

## 2022-01-20 LAB — URINALYSIS, ROUTINE W REFLEX MICROSCOPIC
Bilirubin Urine: NEGATIVE
Glucose, UA: NEGATIVE mg/dL
Hgb urine dipstick: NEGATIVE
Ketones, ur: NEGATIVE mg/dL
Leukocytes,Ua: NEGATIVE
Nitrite: NEGATIVE
Protein, ur: NEGATIVE mg/dL
Specific Gravity, Urine: 1.02 (ref 1.005–1.030)
pH: 6 (ref 5.0–8.0)

## 2022-01-20 MED ORDER — CYCLOBENZAPRINE HCL 5 MG PO TABS
10.0000 mg | ORAL_TABLET | Freq: Once | ORAL | Status: AC
  Administered 2022-01-20: 10 mg via ORAL
  Filled 2022-01-20: qty 2

## 2022-01-20 MED ORDER — ACETAMINOPHEN 325 MG PO TABS
650.0000 mg | ORAL_TABLET | ORAL | 0 refills | Status: AC | PRN
Start: 2022-01-20 — End: 2022-02-19

## 2022-01-20 MED ORDER — CYCLOBENZAPRINE HCL 10 MG PO TABS: 10.0000 mg | ORAL_TABLET | Freq: Two times a day (BID) | ORAL | 0 refills | Status: AC | PRN

## 2022-01-20 MED ORDER — ACETAMINOPHEN 500 MG PO TABS
1000.0000 mg | ORAL_TABLET | Freq: Once | ORAL | Status: AC
  Administered 2022-01-20: 1000 mg via ORAL
  Filled 2022-01-20: qty 2

## 2022-01-20 NOTE — MAU Note (Signed)
Ariana Arellano is a 26 y.o. at [redacted]w[redacted]d here in MAU reporting: walking down  the steps, lost her balance, tried to catch herself, was unable.  Landed on bottom and hit lower back. Happened around 1430/1500.  Having pain in lower back, bottom and back of upper legs. No bleeding or LOF  Onset of complaint: ~2hrs ago Pain score: 10 Vitals:   01/20/22 1657  BP: 129/80  Pulse: 86  Resp: 18  Temp: 98.6 F (37 C)  SpO2: 98%     FHT:156 Lab orders placed from triage:  UA

## 2022-01-20 NOTE — MAU Provider Note (Signed)
History     CSN: [id number]  Arrival date and time: 01/20/22 1624   Event Date/Time   First Provider Initiated Contact with Patient 01/20/22 1726      Chief Complaint  Patient presents with   Back Pain   Fall   HPI Ariana Arellano is a 26 y.o. G3P0020 at [redacted]w[redacted]d who presents to MAU for initial evaluation following a fall. At 1430 today patient lost her balance, her feet came out from under her and she fell onto her low back and tailbone then slid down about two stairs. On arrival to MAU she reports 10/10 pain in her low back and tail bone. Pain radiates bilaterally into her thighs. She denies abdominal trauma. She denies abdominal pain, vaginal bleeding. Her fall was not precipitated by weakness, dizziness, or syncope. She is not experiencing incontinence of urine or stool.  Patient receives prenatal care with [facility].  OB History     Gravida  3   Para      Term      Preterm      AB  2   Living         SAB  1   IAB  1   Ectopic      Multiple      Live Births              Past Medical History:  Diagnosis Date   Anemia     Past Surgical History:  Procedure Laterality Date   INDUCED ABORTION      Family History  Problem Relation Age of Onset   Healthy Mother    Healthy Father     Social History   Tobacco Use   Smoking status: Never   Smokeless tobacco: Never  Vaping Use   Vaping Use: Never used  Substance Use Topics   Alcohol use: No   Drug use: No    Allergies: No Known Allergies  Medications Prior to Admission  Medication Sig Dispense Refill Last Dose   Prenatal Vit-Fe Fumarate-FA (PRENATAL MULTIVITAMIN) TABS tablet Take 1 tablet by mouth daily at 12 noon.   01/20/2022   [DISCONTINUED] acetaminophen (TYLENOL) 325 MG tablet Take 650 mg by mouth every 6 (six) hours as needed.   Past Week   metroNIDAZOLE (FLAGYL) 500 MG tablet Take 1 tablet (500 mg total) by mouth 2 (two) times daily. 14 tablet 0     Review of Systems   Genitourinary:  Positive for pelvic pain.  Musculoskeletal:  Positive for back pain and gait problem.  All other systems reviewed and are negative.  Physical Exam   Blood pressure 128/80, pulse 79, temperature 98.6 F (37 C), temperature source Oral, resp. rate 18, height 5\' 4"  (1.626 m), weight 70.7 kg, last menstrual period 08/21/2021, SpO2 98 %, unknown if currently breastfeeding.  Physical Exam Vitals and nursing note reviewed. Exam conducted with a chaperone present.  Constitutional:      General: She is not in acute distress.    Appearance: Normal appearance. She is not ill-appearing.  Cardiovascular:     Rate and Rhythm: Normal rate.     Pulses: Normal pulses.     Heart sounds: Normal heart sounds.  Pulmonary:     Effort: Pulmonary effort is normal.     Breath sounds: Normal breath sounds.  Abdominal:     Tenderness: There is no left CVA tenderness.     Comments: Gravid  Musculoskeletal:     Cervical back: Normal.  Thoracic back: Tenderness present. No edema, signs of trauma or lacerations.     Lumbar back: Tenderness present. No edema, signs of trauma or lacerations.     Right lower leg: No edema.     Left lower leg: No edema.  Skin:    Capillary Refill: Capillary refill takes less than 2 seconds.  Neurological:     Mental Status: She is alert and oriented to person, place, and time.  Psychiatric:        Mood and Affect: Mood normal.        Behavior: Behavior normal.        Thought Content: Thought content normal.        Judgment: Judgment normal.     MAU Course  Procedures  MDM --Physical exam negative for laceration, bruising, bulging discs --Discussed with [facility] attending, who recommends symptom management. Imaging not indicated --Discussed with patient signs of worsening acuity including change in cap refill, sensation, pallor  Patient Vitals for the past 24 hrs:  BP Temp Temp src Pulse Resp SpO2 Height Weight  01/20/22 1822 128/80 -- --  79 -- -- -- --  01/20/22 1657 129/80 98.6 F (37 C) Oral 86 18 98 % 5\' 4"  (1.626 m) 70.7 kg   Results for orders placed or performed during the hospital encounter of 01/20/22 (from the past 24 hour(s))  Urinalysis, Routine w reflex microscopic Urine, Clean Catch     Status: Abnormal   Collection Time: 01/20/22  5:40 PM  Result Value Ref Range   Color, Urine YELLOW YELLOW   APPearance HAZY (A) CLEAR   Specific Gravity, Urine 1.020 1.005 - 1.030   pH 6.0 5.0 - 8.0   Glucose, UA NEGATIVE NEGATIVE mg/dL   Hgb urine dipstick NEGATIVE NEGATIVE   Bilirubin Urine NEGATIVE NEGATIVE   Ketones, ur NEGATIVE NEGATIVE mg/dL   Protein, ur NEGATIVE NEGATIVE mg/dL   Nitrite NEGATIVE NEGATIVE   Leukocytes,Ua NEGATIVE NEGATIVE   Meds ordered this encounter  Medications   acetaminophen (TYLENOL) tablet 1,000 mg   cyclobenzaprine (FLEXERIL) tablet 10 mg   acetaminophen (TYLENOL) 325 MG tablet    Sig: Take 2 tablets (650 mg total) by mouth every 4 (four) hours as needed for moderate pain.    Dispense:  180 tablet    Refill:  0   cyclobenzaprine (FLEXERIL) 10 MG tablet    Sig: Take 1 tablet (10 mg total) by mouth 2 (two) times daily as needed for muscle spasms.    Dispense:  20 tablet    Refill:  0   Assessment and Plan  --26 y.o. G3P0020 at [redacted]w[redacted]d  --FHT 156 by Doppler --Fall, initial encounter --S/p consult with [facility] Attending --Patient given work note to rest through Tuesday 09/19 --Discharge home in stable condition with symptom management  10/19, MSA, MSN, CNM 01/20/2022, 7:07 PM

## 2022-01-20 NOTE — Discharge Instructions (Signed)

## 2022-02-01 DIAGNOSIS — O43119 Circumvallate placenta, unspecified trimester: Secondary | ICD-10-CM | POA: Diagnosis not present

## 2022-03-02 DIAGNOSIS — Z3483 Encounter for supervision of other normal pregnancy, third trimester: Secondary | ICD-10-CM | POA: Diagnosis not present

## 2022-03-02 DIAGNOSIS — Z118 Encounter for screening for other infectious and parasitic diseases: Secondary | ICD-10-CM | POA: Diagnosis not present

## 2022-03-02 DIAGNOSIS — O43119 Circumvallate placenta, unspecified trimester: Secondary | ICD-10-CM | POA: Diagnosis not present

## 2022-03-02 DIAGNOSIS — Z114 Encounter for screening for human immunodeficiency virus [HIV]: Secondary | ICD-10-CM | POA: Diagnosis not present

## 2022-03-02 DIAGNOSIS — Z112 Encounter for screening for other bacterial diseases: Secondary | ICD-10-CM | POA: Diagnosis not present

## 2022-03-02 DIAGNOSIS — Z2913 Encounter for prophylactic Rho(D) immune globulin: Secondary | ICD-10-CM | POA: Diagnosis not present

## 2022-03-09 DIAGNOSIS — Z3490 Encounter for supervision of normal pregnancy, unspecified, unspecified trimester: Secondary | ICD-10-CM | POA: Diagnosis not present

## 2022-03-09 DIAGNOSIS — O99810 Abnormal glucose complicating pregnancy: Secondary | ICD-10-CM | POA: Diagnosis not present

## 2022-03-13 DIAGNOSIS — Z3490 Encounter for supervision of normal pregnancy, unspecified, unspecified trimester: Secondary | ICD-10-CM | POA: Diagnosis not present

## 2022-03-13 DIAGNOSIS — Z23 Encounter for immunization: Secondary | ICD-10-CM | POA: Diagnosis not present

## 2022-03-13 DIAGNOSIS — O43119 Circumvallate placenta, unspecified trimester: Secondary | ICD-10-CM | POA: Diagnosis not present

## 2022-03-27 DIAGNOSIS — O43119 Circumvallate placenta, unspecified trimester: Secondary | ICD-10-CM | POA: Diagnosis not present

## 2022-03-27 DIAGNOSIS — O24410 Gestational diabetes mellitus in pregnancy, diet controlled: Secondary | ICD-10-CM | POA: Diagnosis not present

## 2022-04-03 DIAGNOSIS — O24410 Gestational diabetes mellitus in pregnancy, diet controlled: Secondary | ICD-10-CM | POA: Diagnosis not present

## 2022-04-11 DIAGNOSIS — Z3490 Encounter for supervision of normal pregnancy, unspecified, unspecified trimester: Secondary | ICD-10-CM | POA: Diagnosis not present

## 2022-04-11 DIAGNOSIS — Z23 Encounter for immunization: Secondary | ICD-10-CM | POA: Diagnosis not present

## 2022-04-24 DIAGNOSIS — O43119 Circumvallate placenta, unspecified trimester: Secondary | ICD-10-CM | POA: Diagnosis not present

## 2022-04-24 DIAGNOSIS — O99810 Abnormal glucose complicating pregnancy: Secondary | ICD-10-CM | POA: Diagnosis not present

## 2022-05-05 DIAGNOSIS — O43119 Circumvallate placenta, unspecified trimester: Secondary | ICD-10-CM | POA: Diagnosis not present

## 2022-05-05 DIAGNOSIS — Z3685 Encounter for antenatal screening for Streptococcus B: Secondary | ICD-10-CM | POA: Diagnosis not present

## 2022-05-05 DIAGNOSIS — O99810 Abnormal glucose complicating pregnancy: Secondary | ICD-10-CM | POA: Diagnosis not present

## 2022-05-11 DIAGNOSIS — O99810 Abnormal glucose complicating pregnancy: Secondary | ICD-10-CM | POA: Diagnosis not present

## 2022-05-11 DIAGNOSIS — Z3A38 38 weeks gestation of pregnancy: Secondary | ICD-10-CM | POA: Diagnosis not present

## 2022-05-11 DIAGNOSIS — Z3A37 37 weeks gestation of pregnancy: Secondary | ICD-10-CM | POA: Diagnosis not present

## 2022-05-11 DIAGNOSIS — O284 Abnormal radiological finding on antenatal screening of mother: Secondary | ICD-10-CM | POA: Diagnosis not present

## 2022-05-13 DIAGNOSIS — Z3A37 37 weeks gestation of pregnancy: Secondary | ICD-10-CM | POA: Diagnosis not present

## 2022-06-19 DIAGNOSIS — N8189 Other female genital prolapse: Secondary | ICD-10-CM | POA: Diagnosis not present

## 2022-06-19 DIAGNOSIS — O24410 Gestational diabetes mellitus in pregnancy, diet controlled: Secondary | ICD-10-CM | POA: Diagnosis not present

## 2022-06-19 DIAGNOSIS — Z1331 Encounter for screening for depression: Secondary | ICD-10-CM | POA: Diagnosis not present

## 2022-06-23 DIAGNOSIS — O24410 Gestational diabetes mellitus in pregnancy, diet controlled: Secondary | ICD-10-CM | POA: Diagnosis not present

## 2022-09-26 DIAGNOSIS — N760 Acute vaginitis: Secondary | ICD-10-CM | POA: Diagnosis not present

## 2022-09-26 DIAGNOSIS — K649 Unspecified hemorrhoids: Secondary | ICD-10-CM | POA: Diagnosis not present

## 2022-09-26 DIAGNOSIS — K921 Melena: Secondary | ICD-10-CM | POA: Diagnosis not present

## 2022-10-26 DIAGNOSIS — Z0000 Encounter for general adult medical examination without abnormal findings: Secondary | ICD-10-CM | POA: Diagnosis not present

## 2022-10-26 DIAGNOSIS — Z113 Encounter for screening for infections with a predominantly sexual mode of transmission: Secondary | ICD-10-CM | POA: Diagnosis not present

## 2022-11-15 DIAGNOSIS — N898 Other specified noninflammatory disorders of vagina: Secondary | ICD-10-CM | POA: Diagnosis not present

## 2022-11-30 DIAGNOSIS — N760 Acute vaginitis: Secondary | ICD-10-CM | POA: Diagnosis not present

## 2022-12-10 DIAGNOSIS — N760 Acute vaginitis: Secondary | ICD-10-CM | POA: Diagnosis not present

## 2022-12-19 DIAGNOSIS — N760 Acute vaginitis: Secondary | ICD-10-CM | POA: Diagnosis not present

## 2023-01-21 DIAGNOSIS — S199XXA Unspecified injury of neck, initial encounter: Secondary | ICD-10-CM | POA: Diagnosis not present

## 2023-01-21 DIAGNOSIS — S299XXA Unspecified injury of thorax, initial encounter: Secondary | ICD-10-CM | POA: Diagnosis not present

## 2023-01-21 DIAGNOSIS — M419 Scoliosis, unspecified: Secondary | ICD-10-CM | POA: Diagnosis not present

## 2023-01-21 DIAGNOSIS — M25512 Pain in left shoulder: Secondary | ICD-10-CM | POA: Diagnosis not present

## 2023-03-31 DIAGNOSIS — R102 Pelvic and perineal pain: Secondary | ICD-10-CM | POA: Diagnosis not present

## 2023-03-31 DIAGNOSIS — R070 Pain in throat: Secondary | ICD-10-CM | POA: Diagnosis not present

## 2023-06-26 DIAGNOSIS — N760 Acute vaginitis: Secondary | ICD-10-CM | POA: Diagnosis not present

## 2023-06-26 DIAGNOSIS — N390 Urinary tract infection, site not specified: Secondary | ICD-10-CM | POA: Diagnosis not present

## 2023-07-17 DIAGNOSIS — R300 Dysuria: Secondary | ICD-10-CM | POA: Diagnosis not present

## 2023-07-17 DIAGNOSIS — N760 Acute vaginitis: Secondary | ICD-10-CM | POA: Diagnosis not present

## 2023-08-28 IMAGING — CR DG KNEE COMPLETE 4+V*L*
4 series · 4 of 4 positions shown · non-contrast
Comparison: None.

CLINICAL DATA: Left knee trauma.

EXAM:
LEFT KNEE - COMPLETE 4+ VIEW

[knee ap]
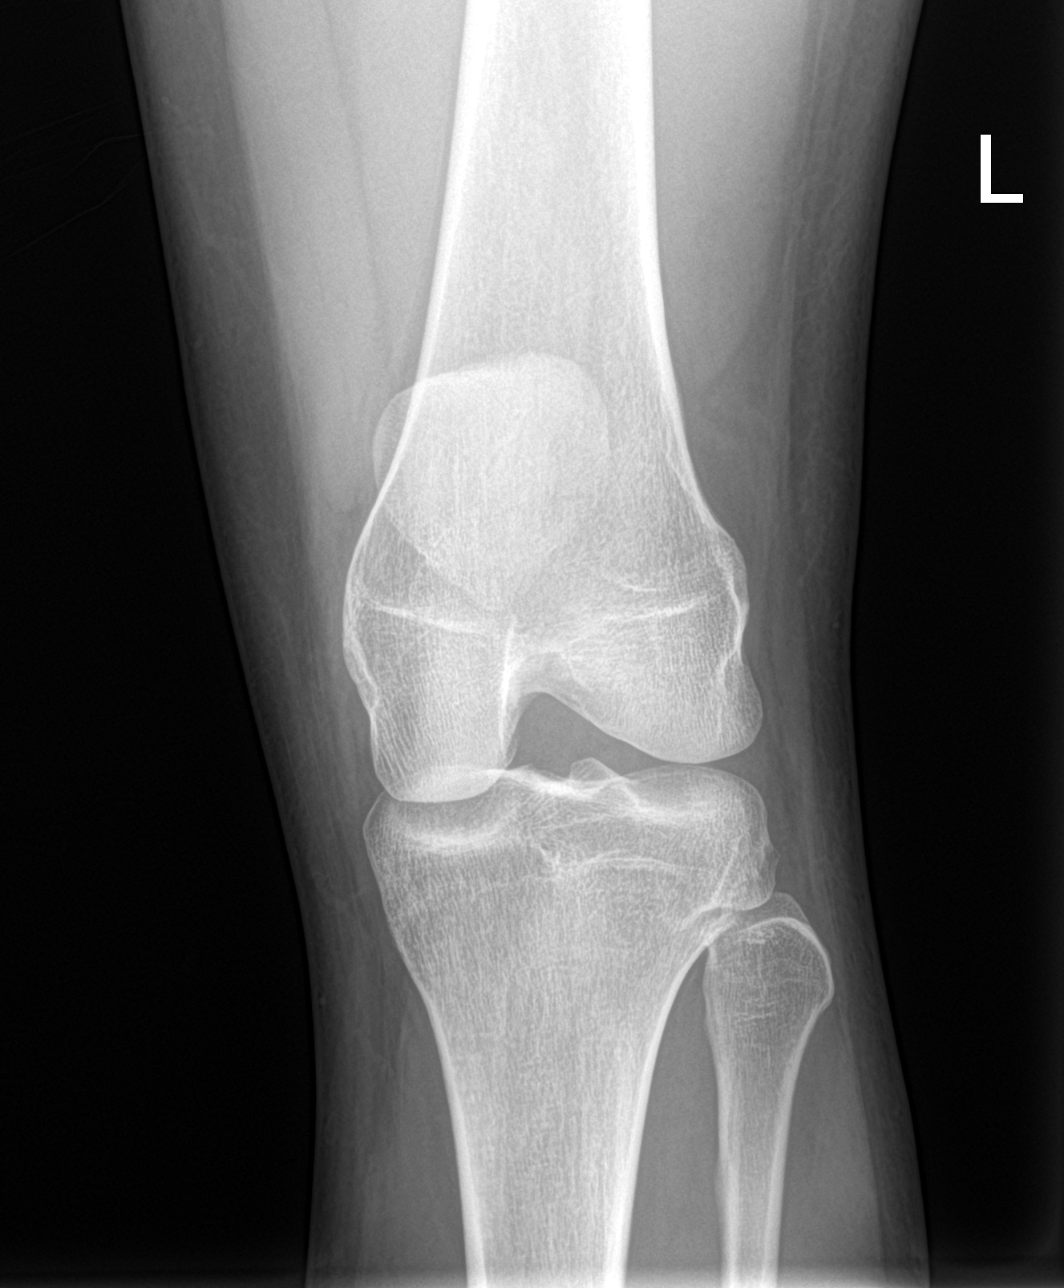

[knee lat]
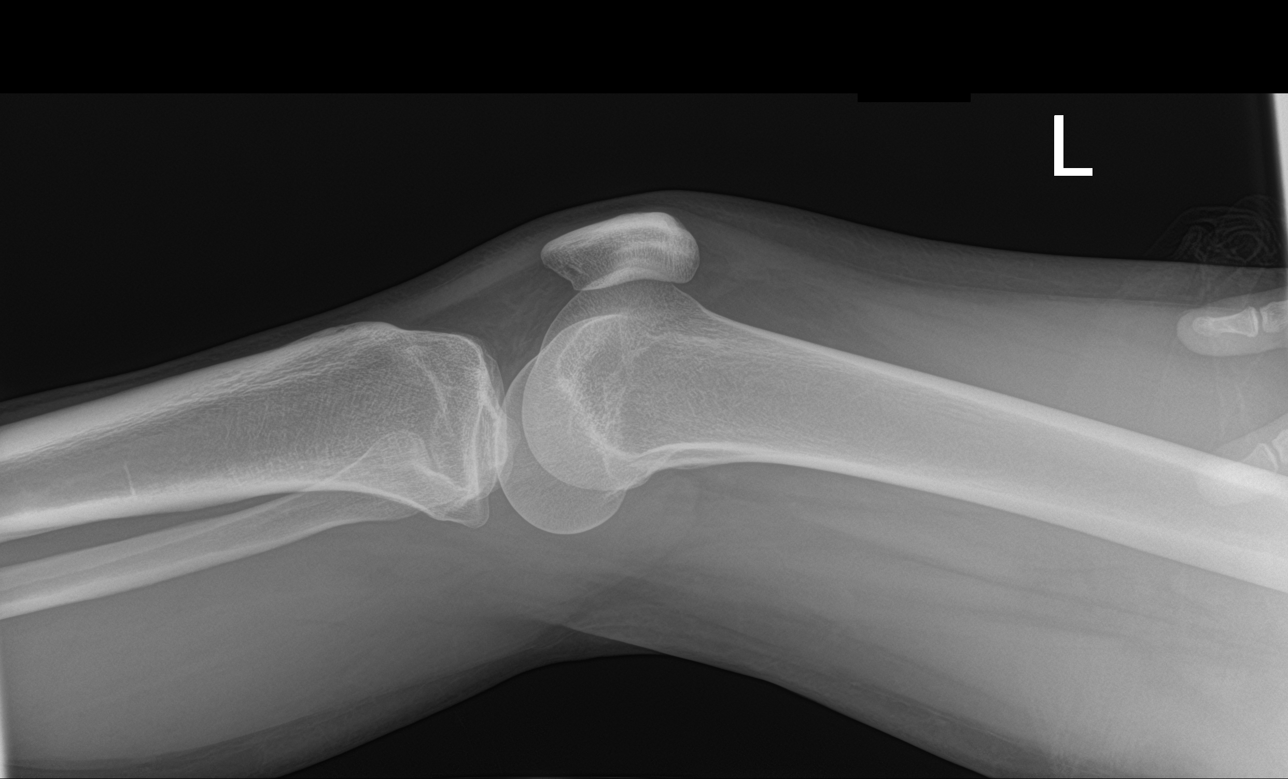

[knee obl (1 of 2)]
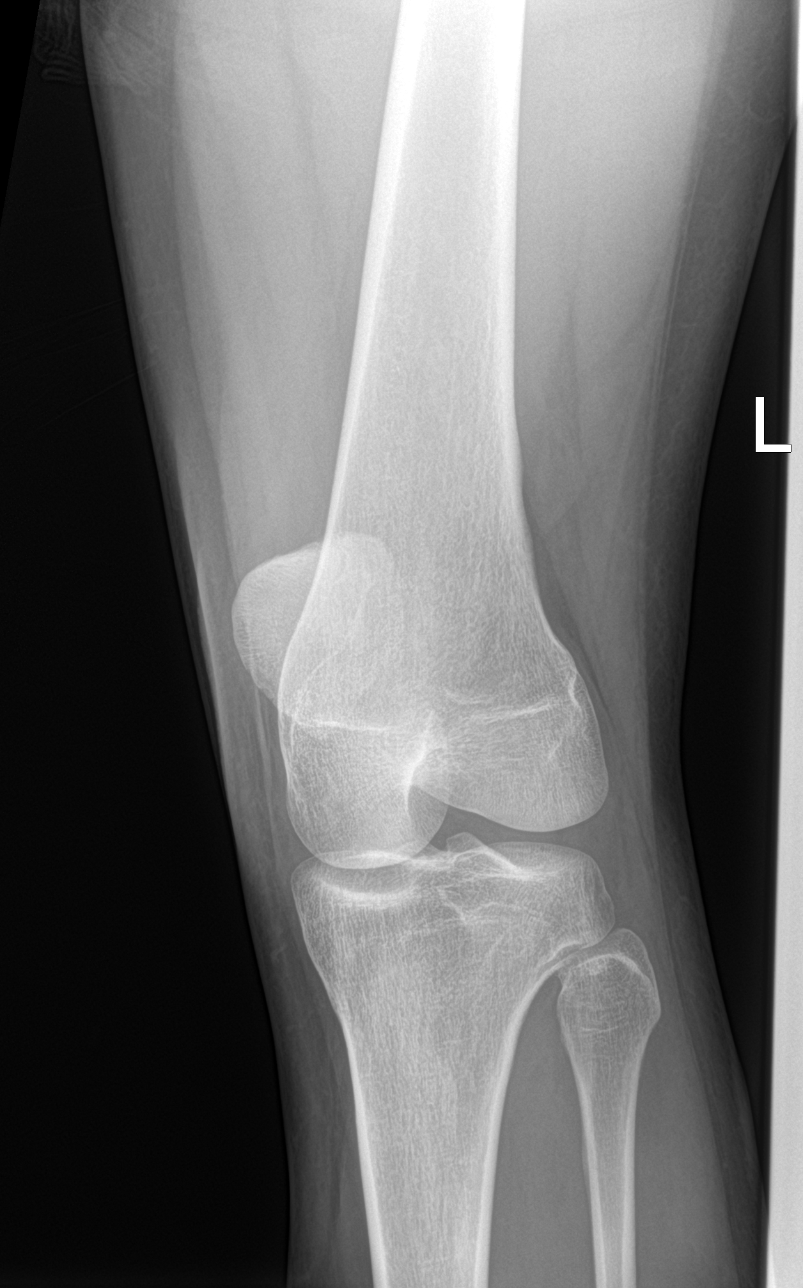

[knee obl (2 of 2)]
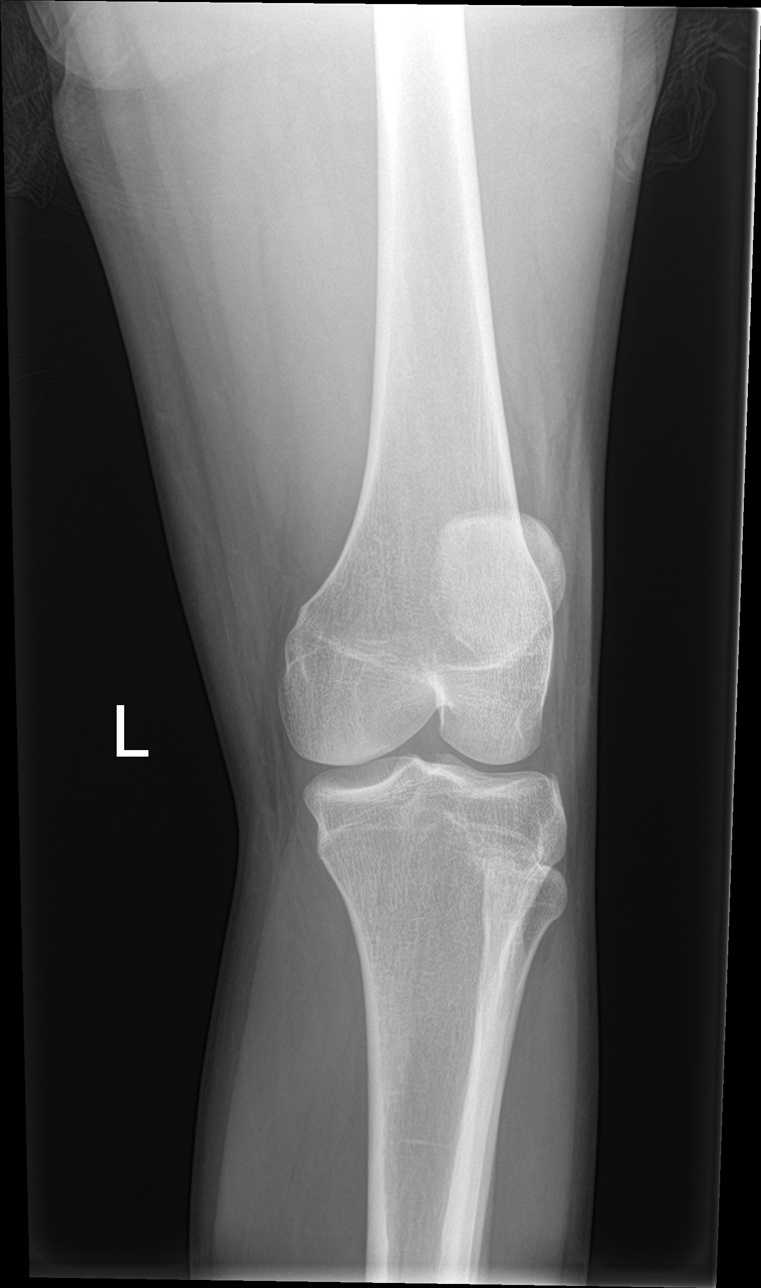

[4 of 4 positions shown; findings below may reference images not displayed]

FINDINGS: No evidence of fracture, dislocation, or joint effusion. No evidence
of arthropathy or other focal bone abnormality. Soft tissues are
unremarkable.
IMPRESSION: Negative.
